# Patient Record
Sex: Male | Born: 1965
Health system: Southern US, Community
[De-identification: ages and names within clinical notes are randomized; demographics above are authoritative.]

## PROBLEM LIST (undated history)

## (undated) DIAGNOSIS — Z720 Tobacco use: Secondary | ICD-10-CM

## (undated) DIAGNOSIS — I2510 Atherosclerotic heart disease of native coronary artery without angina pectoris: Secondary | ICD-10-CM

## (undated) DIAGNOSIS — I5042 Chronic combined systolic (congestive) and diastolic (congestive) heart failure: Secondary | ICD-10-CM

## (undated) DIAGNOSIS — I219 Acute myocardial infarction, unspecified: Secondary | ICD-10-CM

## (undated) DIAGNOSIS — R0600 Dyspnea, unspecified: Secondary | ICD-10-CM

## (undated) DIAGNOSIS — F1010 Alcohol abuse, uncomplicated: Secondary | ICD-10-CM

## (undated) DIAGNOSIS — I255 Ischemic cardiomyopathy: Secondary | ICD-10-CM

## (undated) DIAGNOSIS — I340 Nonrheumatic mitral (valve) insufficiency: Secondary | ICD-10-CM

## (undated) DIAGNOSIS — F419 Anxiety disorder, unspecified: Secondary | ICD-10-CM

## (undated) HISTORY — PX: ANKLE SURGERY: SHX546

## (undated) HISTORY — PX: TONSILLECTOMY: SUR1361

## (undated) HISTORY — PX: SHOULDER SURGERY: SHX246

## (undated) HISTORY — DX: Atherosclerotic heart disease of native coronary artery without angina pectoris: I25.10

## (undated) HISTORY — PX: CARDIAC CATHETERIZATION: SHX172

## (undated) HISTORY — DX: Chronic combined systolic (congestive) and diastolic (congestive) heart failure: I50.42

## (undated) HISTORY — DX: Alcohol abuse, uncomplicated: F10.10

## (undated) HISTORY — DX: Nonrheumatic mitral (valve) insufficiency: I34.0

## (undated) HISTORY — DX: Tobacco use: Z72.0

## (undated) HISTORY — DX: Ischemic cardiomyopathy: I25.5

---

## 2000-08-18 ENCOUNTER — Observation Stay (HOSPITAL_COMMUNITY): Admission: RE | Admit: 2000-08-18 | Discharge: 2000-08-18 | Payer: Self-pay | Admitting: Orthopedic Surgery

## 2000-08-18 ENCOUNTER — Encounter: Payer: Self-pay | Admitting: Orthopedic Surgery

## 2000-10-21 ENCOUNTER — Ambulatory Visit (HOSPITAL_COMMUNITY): Admission: RE | Admit: 2000-10-21 | Discharge: 2000-10-21 | Payer: Self-pay | Admitting: Orthopedic Surgery

## 2010-05-25 NOTE — Op Note (Signed)
[facility]  Patient:    Eduardo [name], Eduardo [name] Visit Number: [id number] MRN: [id number]          Service Type: DSU Location: DAY Attending Physician:  Marlowe Kays Page Proc. Date: 10/21/00 Admit Date:  10/21/2000                             Operative Report  PREOPERATIVE DIAGNOSIS:  Retained Kirschner wire, left shoulder, status post acromioclavicular separation and repair.  POSTOPERATIVE DIAGNOSIS:  Retained Kirschner wire, left shoulder, status post acromioclavicular separation and repair.  OPERATION:  Removal of buried K-wire left shoulder.  SURGEON:  Illene Labrador. Aplington, M.D.  ASSISTANT:  Nurse.  ANESTHESIA:  MAC.  PATHOLOGY AND JUSTIFICATION FOR PROCEDURE:  I repaired his AC joint, stabilizing the two K-wires on 08/18/00.  One of the K-wires has worked its way out.  The other is time-wise ready for removal at this time.  It is buried in the subacromial area.  DESCRIPTION OF PROCEDURE:  Satisfactory MAC anesthesia, semi-setting position, with folded sheets beneath the left scapula, left shoulder was prepped with DuraPrep and draped out in a squared-off fashion with towels and Ioban, draped in a sterile field.  The area of tenderness and what I felt was the pin was at the posterior portion of the previous incision.  This was infiltrated with 0.5% Marcaine with adrenalin, and I then made an incision at this location and with blunt dissection, located the pin deep.  This was removed without difficulty.  I anesthetized the deeper portion of the wound once again with the Marcaine and adrenalin and closed the subcutaneous tissue with interrupted 3-0 Vicryl and the skin with interrupted 4-0 nylon mattress sutures.  Betadine and a band-aid patch were applied.  He tolerated the procedure well and at the time of this dictation was on his way to the recovery room in satisfactory condition with no known complication. Attending Physician:  Joaquin Courts DD:  10/21/00 TD:  10/21/00 Job: [id number]
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
## 2016-10-11 ENCOUNTER — Emergency Department (HOSPITAL_COMMUNITY)
Admission: EM | Admit: 2016-10-11 | Discharge: 2016-10-12 | Disposition: A | Discharge status: Home / Self Care | Payer: BLUE CROSS/BLUE SHIELD | Attending: Emergency Medicine | Admitting: Emergency Medicine

## 2016-10-11 ENCOUNTER — Encounter (HOSPITAL_COMMUNITY): Payer: Self-pay

## 2016-10-11 DIAGNOSIS — S82202A Unspecified fracture of shaft of left tibia, initial encounter for closed fracture: Secondary | ICD-10-CM

## 2016-10-11 DIAGNOSIS — F17200 Nicotine dependence, unspecified, uncomplicated: Secondary | ICD-10-CM | POA: Insufficient documentation

## 2016-10-11 DIAGNOSIS — S99912A Unspecified injury of left ankle, initial encounter: Secondary | ICD-10-CM | POA: Diagnosis present

## 2016-10-11 DIAGNOSIS — Y998 Other external cause status: Secondary | ICD-10-CM | POA: Diagnosis not present

## 2016-10-11 DIAGNOSIS — S82832A Other fracture of upper and lower end of left fibula, initial encounter for closed fracture: Secondary | ICD-10-CM

## 2016-10-11 DIAGNOSIS — Y9389 Activity, other specified: Secondary | ICD-10-CM | POA: Insufficient documentation

## 2016-10-11 DIAGNOSIS — Y929 Unspecified place or not applicable: Secondary | ICD-10-CM | POA: Diagnosis not present
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
## 2016-10-12 ENCOUNTER — Emergency Department (HOSPITAL_COMMUNITY): Payer: BLUE CROSS/BLUE SHIELD

## 2016-10-12 MED ORDER — HYDROCODONE-ACETAMINOPHEN 5-325 MG PO TABS: 1.0000 | ORAL_TABLET | Freq: Four times a day (QID) | ORAL | 0 refills | Status: DC | PRN

## 2016-10-12 MED ORDER — MORPHINE SULFATE (PF) 4 MG/ML IV SOLN
4.0000 mg | Freq: Once | INTRAVENOUS | Status: AC
  Administered 2016-10-12: 4 mg via INTRAVENOUS
  Filled 2016-10-12: qty 1
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
## 2016-10-12 NOTE — ED Notes (Signed)
Ortho tech paged  

## 2016-10-12 NOTE — ED Notes (Signed)
Pt stable, states understanding of discharge instructions, family at bedside 

## 2016-10-17 ENCOUNTER — Ambulatory Visit (INDEPENDENT_AMBULATORY_CARE_PROVIDER_SITE_OTHER): Payer: Self-pay | Admitting: Orthopedic Surgery

## 2016-10-18 ENCOUNTER — Encounter (HOSPITAL_COMMUNITY): Payer: Self-pay

## 2016-10-18 ENCOUNTER — Other Ambulatory Visit: Payer: Self-pay | Admitting: Orthopedic Surgery

## 2016-10-18 NOTE — Progress Notes (Signed)
Pt has history of heart attack and cardiac catheterization in July of 2008. Pt states he had his cath done at [facility]. No documents were found from care everywhere concerning his cath. Pt also states that he has not seen a cardiologist in 10 years. He also does not have a recent EKG. Anesthesia was notified- Dr. Chaney Malling was notified and stated that patient's surgery may need to be delayed or may be cancelled until cardiology can see patient.

## 2016-10-21 ENCOUNTER — Other Ambulatory Visit: Payer: Self-pay | Admitting: Cardiology

## 2016-10-21 ENCOUNTER — Inpatient Hospital Stay (HOSPITAL_COMMUNITY): Payer: BLUE CROSS/BLUE SHIELD | Admitting: Emergency Medicine

## 2016-10-21 ENCOUNTER — Inpatient Hospital Stay (HOSPITAL_COMMUNITY): Payer: BLUE CROSS/BLUE SHIELD

## 2016-10-21 ENCOUNTER — Encounter (HOSPITAL_COMMUNITY): Payer: Self-pay | Admitting: *Deleted

## 2016-10-21 ENCOUNTER — Inpatient Hospital Stay (HOSPITAL_COMMUNITY)
Admission: RE | Admit: 2016-10-21 | Discharge: 2016-10-27 | DRG: 492 | Disposition: A | Discharge status: Home / Self Care | Payer: BLUE CROSS/BLUE SHIELD | Source: Ambulatory Visit | Attending: Internal Medicine | Admitting: Internal Medicine

## 2016-10-21 ENCOUNTER — Encounter (HOSPITAL_COMMUNITY)
Admission: RE | Disposition: A | Discharge status: Home / Self Care | Payer: Self-pay | Source: Ambulatory Visit | Attending: Internal Medicine

## 2016-10-21 DIAGNOSIS — Z888 Allergy status to other drugs, medicaments and biological substances status: Secondary | ICD-10-CM

## 2016-10-21 DIAGNOSIS — Z823 Family history of stroke: Secondary | ICD-10-CM

## 2016-10-21 DIAGNOSIS — R079 Chest pain, unspecified: Secondary | ICD-10-CM

## 2016-10-21 DIAGNOSIS — Z880 Allergy status to penicillin: Secondary | ICD-10-CM | POA: Diagnosis not present

## 2016-10-21 DIAGNOSIS — I255 Ischemic cardiomyopathy: Secondary | ICD-10-CM | POA: Diagnosis present

## 2016-10-21 DIAGNOSIS — J989 Respiratory disorder, unspecified: Secondary | ICD-10-CM | POA: Diagnosis not present

## 2016-10-21 DIAGNOSIS — Z955 Presence of coronary angioplasty implant and graft: Secondary | ICD-10-CM | POA: Diagnosis not present

## 2016-10-21 DIAGNOSIS — J810 Acute pulmonary edema: Secondary | ICD-10-CM | POA: Diagnosis present

## 2016-10-21 DIAGNOSIS — I340 Nonrheumatic mitral (valve) insufficiency: Secondary | ICD-10-CM | POA: Diagnosis not present

## 2016-10-21 DIAGNOSIS — I208 Other forms of angina pectoris: Secondary | ICD-10-CM | POA: Diagnosis not present

## 2016-10-21 DIAGNOSIS — S82202A Unspecified fracture of shaft of left tibia, initial encounter for closed fracture: Secondary | ICD-10-CM | POA: Diagnosis present

## 2016-10-21 DIAGNOSIS — I25110 Atherosclerotic heart disease of native coronary artery with unstable angina pectoris: Secondary | ICD-10-CM | POA: Diagnosis not present

## 2016-10-21 DIAGNOSIS — S82112A Displaced fracture of left tibial spine, initial encounter for closed fracture: Secondary | ICD-10-CM | POA: Diagnosis not present

## 2016-10-21 DIAGNOSIS — R0902 Hypoxemia: Secondary | ICD-10-CM

## 2016-10-21 DIAGNOSIS — J9601 Acute respiratory failure with hypoxia: Secondary | ICD-10-CM | POA: Diagnosis present

## 2016-10-21 DIAGNOSIS — F10239 Alcohol dependence with withdrawal, unspecified: Secondary | ICD-10-CM | POA: Diagnosis present

## 2016-10-21 DIAGNOSIS — Z79899 Other long term (current) drug therapy: Secondary | ICD-10-CM | POA: Diagnosis not present

## 2016-10-21 DIAGNOSIS — I5021 Acute systolic (congestive) heart failure: Secondary | ICD-10-CM | POA: Diagnosis not present

## 2016-10-21 DIAGNOSIS — R748 Abnormal levels of other serum enzymes: Secondary | ICD-10-CM | POA: Diagnosis not present

## 2016-10-21 DIAGNOSIS — F17210 Nicotine dependence, cigarettes, uncomplicated: Secondary | ICD-10-CM | POA: Diagnosis present

## 2016-10-21 DIAGNOSIS — E876 Hypokalemia: Secondary | ICD-10-CM | POA: Diagnosis present

## 2016-10-21 DIAGNOSIS — I5043 Acute on chronic combined systolic (congestive) and diastolic (congestive) heart failure: Secondary | ICD-10-CM | POA: Diagnosis present

## 2016-10-21 DIAGNOSIS — I214 Non-ST elevation (NSTEMI) myocardial infarction: Secondary | ICD-10-CM

## 2016-10-21 DIAGNOSIS — Z7982 Long term (current) use of aspirin: Secondary | ICD-10-CM | POA: Diagnosis not present

## 2016-10-21 DIAGNOSIS — F329 Major depressive disorder, single episode, unspecified: Secondary | ICD-10-CM | POA: Diagnosis present

## 2016-10-21 DIAGNOSIS — E785 Hyperlipidemia, unspecified: Secondary | ICD-10-CM | POA: Diagnosis present

## 2016-10-21 DIAGNOSIS — Z9889 Other specified postprocedural states: Secondary | ICD-10-CM

## 2016-10-21 DIAGNOSIS — I110 Hypertensive heart disease with heart failure: Secondary | ICD-10-CM | POA: Diagnosis present

## 2016-10-21 DIAGNOSIS — I5042 Chronic combined systolic (congestive) and diastolic (congestive) heart failure: Secondary | ICD-10-CM

## 2016-10-21 DIAGNOSIS — I2510 Atherosclerotic heart disease of native coronary artery without angina pectoris: Secondary | ICD-10-CM | POA: Diagnosis present

## 2016-10-21 DIAGNOSIS — I2582 Chronic total occlusion of coronary artery: Secondary | ICD-10-CM | POA: Diagnosis present

## 2016-10-21 DIAGNOSIS — S82102A Unspecified fracture of upper end of left tibia, initial encounter for closed fracture: Secondary | ICD-10-CM | POA: Diagnosis not present

## 2016-10-21 DIAGNOSIS — I252 Old myocardial infarction: Secondary | ICD-10-CM | POA: Diagnosis present

## 2016-10-21 DIAGNOSIS — W19XXXA Unspecified fall, initial encounter: Secondary | ICD-10-CM | POA: Diagnosis present

## 2016-10-21 DIAGNOSIS — R778 Other specified abnormalities of plasma proteins: Secondary | ICD-10-CM

## 2016-10-21 DIAGNOSIS — R7989 Other specified abnormal findings of blood chemistry: Secondary | ICD-10-CM

## 2016-10-21 DIAGNOSIS — Z885 Allergy status to narcotic agent status: Secondary | ICD-10-CM | POA: Diagnosis not present

## 2016-10-21 DIAGNOSIS — I509 Heart failure, unspecified: Secondary | ICD-10-CM | POA: Diagnosis not present

## 2016-10-21 DIAGNOSIS — Z419 Encounter for procedure for purposes other than remedying health state, unspecified: Secondary | ICD-10-CM

## 2016-10-21 DIAGNOSIS — I426 Alcoholic cardiomyopathy: Secondary | ICD-10-CM | POA: Diagnosis present

## 2016-10-21 DIAGNOSIS — R000 Tachycardia, unspecified: Secondary | ICD-10-CM | POA: Diagnosis present

## 2016-10-21 DIAGNOSIS — S82252A Displaced comminuted fracture of shaft of left tibia, initial encounter for closed fracture: Secondary | ICD-10-CM | POA: Diagnosis present

## 2016-10-21 HISTORY — DX: Dyspnea, unspecified: R06.00

## 2016-10-21 HISTORY — PX: TIBIA IM NAIL INSERTION: SHX2516

## 2016-10-21 HISTORY — DX: Anxiety disorder, unspecified: F41.9

## 2016-10-21 HISTORY — DX: Acute myocardial infarction, unspecified: I21.9

## 2016-10-21 LAB — SURGICAL PCR SCREEN
MRSA, PCR: NEGATIVE
Staphylococcus aureus: NEGATIVE

## 2016-10-21 LAB — BASIC METABOLIC PANEL
Anion gap: 10 (ref 5–15)
BUN: 10 mg/dL (ref 6–20)
CHLORIDE: 104 mmol/L (ref 101–111)
CO2: 26 mmol/L (ref 22–32)
CREATININE: 0.84 mg/dL (ref 0.61–1.24)
Calcium: 9.1 mg/dL (ref 8.9–10.3)
GFR calc Af Amer: >60 mL/min (ref >60)
GFR calc non Af Amer: >60 mL/min (ref >60)
GLUCOSE: 104 mg/dL — AB (ref 65–99)
Potassium: 4.1 mmol/L (ref 3.5–5.1)
SODIUM: 140 mmol/L (ref 135–145)

## 2016-10-21 LAB — CBC
HEMATOCRIT: 37.2 % — AB (ref 39.0–52.0)
Hemoglobin: 12.5 g/dL — ABNORMAL LOW (ref 13.0–17.0)
MCH: 33.6 pg (ref 26.0–34.0)
MCHC: 33.6 g/dL (ref 30.0–36.0)
MCV: 100 fL (ref 78.0–100.0)
PLATELETS: 321 10*3/uL (ref 150–400)
RBC: 3.72 MIL/uL — ABNORMAL LOW (ref 4.22–5.81)
RDW: 12 % (ref 11.5–15.5)
WBC: 13.3 10*3/uL — ABNORMAL HIGH (ref 4.0–10.5)

## 2016-10-21 SURGERY — INSERTION, INTRAMEDULLARY ROD, TIBIA
Anesthesia: Spinal | Site: Leg Lower | Laterality: Left

## 2016-10-21 MED ORDER — PROPOFOL 500 MG/50ML IV EMUL
INTRAVENOUS | Status: DC | PRN
  Administered 2016-10-21: 50 ug/kg/min via INTRAVENOUS

## 2016-10-21 MED ORDER — HYDROMORPHONE HCL 1 MG/ML IJ SOLN
INTRAMUSCULAR | Status: AC
  Filled 2016-10-21: qty 1

## 2016-10-21 MED ORDER — LACTATED RINGERS IV SOLN
INTRAVENOUS | Status: DC
  Administered 2016-10-21 (×3): via INTRAVENOUS

## 2016-10-21 MED ORDER — METHOCARBAMOL 500 MG PO TABS
ORAL_TABLET | ORAL | Status: AC
  Administered 2016-10-21: 500 mg via ORAL
  Filled 2016-10-21: qty 1

## 2016-10-21 MED ORDER — ZOLPIDEM TARTRATE 5 MG PO TABS
5.0000 mg | ORAL_TABLET | Freq: Every evening | ORAL | Status: DC | PRN
  Administered 2016-10-21 – 2016-10-26 (×5): 5 mg via ORAL
  Filled 2016-10-21 (×5): qty 1

## 2016-10-21 MED ORDER — SODIUM CHLORIDE 0.9 % IV SOLN
INTRAVENOUS | Status: DC
  Administered 2016-10-21: 19:00:00 via INTRAVENOUS

## 2016-10-21 MED ORDER — METHOCARBAMOL 1000 MG/10ML IJ SOLN
500.0000 mg | Freq: Four times a day (QID) | INTRAMUSCULAR | Status: DC | PRN
  Filled 2016-10-21: qty 5

## 2016-10-21 MED ORDER — FENTANYL CITRATE (PF) 100 MCG/2ML IJ SOLN: 25.0000 ug | INTRAMUSCULAR | Status: DC | PRN

## 2016-10-21 MED ORDER — OXYCODONE-ACETAMINOPHEN 5-325 MG PO TABS: 1.0000 | ORAL_TABLET | ORAL | 0 refills | Status: DC | PRN

## 2016-10-21 MED ORDER — FENTANYL CITRATE (PF) 250 MCG/5ML IJ SOLN
INTRAMUSCULAR | Status: DC | PRN
  Administered 2016-10-21 (×3): 50 ug via INTRAVENOUS

## 2016-10-21 MED ORDER — BUPIVACAINE-EPINEPHRINE 0.5% -1:200000 IJ SOLN
INTRAMUSCULAR | Status: DC | PRN
  Administered 2016-10-21: 30 mL

## 2016-10-21 MED ORDER — BUPIVACAINE-EPINEPHRINE (PF) 0.5% -1:200000 IJ SOLN
INTRAMUSCULAR | Status: AC
  Filled 2016-10-21: qty 30

## 2016-10-21 MED ORDER — ACETAMINOPHEN 650 MG RE SUPP: 650.0000 mg | Freq: Four times a day (QID) | RECTAL | Status: DC | PRN

## 2016-10-21 MED ORDER — BISACODYL 5 MG PO TBEC
5.0000 mg | DELAYED_RELEASE_TABLET | Freq: Every day | ORAL | Status: DC | PRN
  Administered 2016-10-24 – 2016-10-26 (×2): 5 mg via ORAL
  Filled 2016-10-21 (×2): qty 1

## 2016-10-21 MED ORDER — MEPERIDINE HCL 25 MG/ML IJ SOLN: 6.2500 mg | INTRAMUSCULAR | Status: DC | PRN

## 2016-10-21 MED ORDER — ONDANSETRON HCL 4 MG/2ML IJ SOLN
INTRAMUSCULAR | Status: AC
  Filled 2016-10-21: qty 2

## 2016-10-21 MED ORDER — TIZANIDINE HCL 2 MG PO TABS: 2.0000 mg | ORAL_TABLET | Freq: Three times a day (TID) | ORAL | 0 refills | Status: DC | PRN

## 2016-10-21 MED ORDER — HYDROMORPHONE HCL 1 MG/ML IJ SOLN
0.5000 mg | INTRAMUSCULAR | Status: DC | PRN
  Administered 2016-10-21 – 2016-10-22 (×3): 1 mg via INTRAVENOUS
  Administered 2016-10-23 – 2016-10-24 (×3): 0.5 mg via INTRAVENOUS
  Administered 2016-10-25: 1 mg via INTRAVENOUS
  Filled 2016-10-21 (×4): qty 1
  Filled 2016-10-21 (×3): qty 0.5

## 2016-10-21 MED ORDER — ACETAMINOPHEN 325 MG PO TABS: 650.0000 mg | ORAL_TABLET | Freq: Four times a day (QID) | ORAL | Status: DC | PRN

## 2016-10-21 MED ORDER — LIDOCAINE 2% (20 MG/ML) 5 ML SYRINGE
INTRAMUSCULAR | Status: AC
  Filled 2016-10-21: qty 5

## 2016-10-21 MED ORDER — METOCLOPRAMIDE HCL 5 MG/ML IJ SOLN: 10.0000 mg | Freq: Once | INTRAMUSCULAR | Status: DC | PRN

## 2016-10-21 MED ORDER — OXYCODONE HCL 5 MG PO TABS
5.0000 mg | ORAL_TABLET | ORAL | Status: DC | PRN
  Administered 2016-10-21 (×2): 10 mg via ORAL
  Administered 2016-10-22: 5 mg via ORAL
  Administered 2016-10-22: 10 mg via ORAL
  Administered 2016-10-23 – 2016-10-24 (×2): 5 mg via ORAL
  Administered 2016-10-26: 10 mg via ORAL
  Administered 2016-10-26: 5 mg via ORAL
  Administered 2016-10-27: 10 mg via ORAL
  Filled 2016-10-21 (×2): qty 2
  Filled 2016-10-21 (×3): qty 1
  Filled 2016-10-21 (×3): qty 2
  Filled 2016-10-21: qty 1

## 2016-10-21 MED ORDER — LACTATED RINGERS IV SOLN: INTRAVENOUS | Status: DC

## 2016-10-21 MED ORDER — CLINDAMYCIN PHOSPHATE 900 MG/50ML IV SOLN
900.0000 mg | INTRAVENOUS | Status: AC
  Administered 2016-10-21: 900 mg via INTRAVENOUS

## 2016-10-21 MED ORDER — POLYETHYLENE GLYCOL 3350 17 G PO PACK
17.0000 g | PACK | Freq: Every day | ORAL | Status: DC | PRN
  Administered 2016-10-24: 17 g via ORAL
  Filled 2016-10-21: qty 1

## 2016-10-21 MED ORDER — ONDANSETRON HCL 4 MG/2ML IJ SOLN
4.0000 mg | Freq: Four times a day (QID) | INTRAMUSCULAR | Status: DC | PRN
  Administered 2016-10-23: 4 mg via INTRAVENOUS
  Filled 2016-10-21 (×3): qty 2

## 2016-10-21 MED ORDER — CEFAZOLIN SODIUM-DEXTROSE 2-4 GM/100ML-% IV SOLN
2.0000 g | Freq: Four times a day (QID) | INTRAVENOUS | Status: AC
  Administered 2016-10-21 – 2016-10-22 (×3): 2 g via INTRAVENOUS
  Filled 2016-10-21 (×4): qty 100

## 2016-10-21 MED ORDER — METHOCARBAMOL 500 MG PO TABS
500.0000 mg | ORAL_TABLET | Freq: Four times a day (QID) | ORAL | Status: DC | PRN
  Administered 2016-10-21: 500 mg via ORAL
  Filled 2016-10-21 (×2): qty 1

## 2016-10-21 MED ORDER — NITROGLYCERIN IN D5W 200-5 MCG/ML-% IV SOLN
INTRAVENOUS | Status: DC | PRN
  Administered 2016-10-21: 20 ug/min via INTRAVENOUS

## 2016-10-21 MED ORDER — ONDANSETRON HCL 4 MG PO TABS
4.0000 mg | ORAL_TABLET | Freq: Four times a day (QID) | ORAL | Status: DC | PRN
  Filled 2016-10-21: qty 1

## 2016-10-21 MED ORDER — TERBINAFINE HCL 1 % EX CREA
TOPICAL_CREAM | Freq: Every day | CUTANEOUS | Status: DC
  Administered 2016-10-21 – 2016-10-24 (×3): via TOPICAL
  Administered 2016-10-25: 1 via TOPICAL
  Administered 2016-10-26: 14:00:00 via TOPICAL
  Administered 2016-10-27: 1 via TOPICAL
  Filled 2016-10-21 (×3): qty 12

## 2016-10-21 MED ORDER — DOCUSATE SODIUM 100 MG PO CAPS
100.0000 mg | ORAL_CAPSULE | Freq: Two times a day (BID) | ORAL | Status: DC
  Administered 2016-10-21 – 2016-10-26 (×7): 100 mg via ORAL
  Filled 2016-10-21 (×10): qty 1

## 2016-10-21 MED ORDER — MIDAZOLAM HCL 2 MG/2ML IJ SOLN
INTRAMUSCULAR | Status: DC | PRN
  Administered 2016-10-21: 2 mg via INTRAVENOUS

## 2016-10-21 MED ORDER — 0.9 % SODIUM CHLORIDE (POUR BTL) OPTIME
TOPICAL | Status: DC | PRN
  Administered 2016-10-21: 1000 mL

## 2016-10-21 MED ORDER — METOPROLOL SUCCINATE ER 25 MG PO TB24
12.5000 mg | ORAL_TABLET | Freq: Every day | ORAL | Status: DC
  Administered 2016-10-22 – 2016-10-23 (×2): 12.5 mg via ORAL
  Filled 2016-10-21 (×3): qty 1

## 2016-10-21 MED ORDER — POVIDONE-IODINE 10 % EX SWAB: 2.0000 "application " | Freq: Once | CUTANEOUS | Status: DC

## 2016-10-21 MED ORDER — BUPIVACAINE IN DEXTROSE 0.75-8.25 % IT SOLN
INTRATHECAL | Status: DC | PRN
  Administered 2016-10-21: 1.5 mL via INTRATHECAL

## 2016-10-21 MED ORDER — MIDAZOLAM HCL 2 MG/2ML IJ SOLN
INTRAMUSCULAR | Status: AC
  Filled 2016-10-21: qty 2

## 2016-10-21 MED ORDER — ATORVASTATIN CALCIUM 20 MG PO TABS: 20.0000 mg | ORAL_TABLET | Freq: Every day | ORAL | Status: DC

## 2016-10-21 MED ORDER — FENTANYL CITRATE (PF) 250 MCG/5ML IJ SOLN
INTRAMUSCULAR | Status: AC
  Filled 2016-10-21: qty 5

## 2016-10-21 MED ORDER — PROPOFOL 10 MG/ML IV BOLUS
INTRAVENOUS | Status: AC
  Filled 2016-10-21: qty 20

## 2016-10-21 MED ORDER — ONDANSETRON HCL 4 MG/2ML IJ SOLN
INTRAMUSCULAR | Status: DC | PRN
  Administered 2016-10-21: 4 mg via INTRAVENOUS

## 2016-10-21 MED ORDER — SIMVASTATIN 40 MG PO TABS
40.0000 mg | ORAL_TABLET | Freq: Every day | ORAL | Status: DC
  Administered 2016-10-21 – 2016-10-23 (×3): 40 mg via ORAL
  Filled 2016-10-21 (×3): qty 1

## 2016-10-21 MED ORDER — CLINDAMYCIN PHOSPHATE 900 MG/50ML IV SOLN
INTRAVENOUS | Status: AC
  Filled 2016-10-21: qty 50

## 2016-10-21 MED ORDER — OXYCODONE HCL 5 MG PO TABS
ORAL_TABLET | ORAL | Status: AC
  Administered 2016-10-21: 10 mg via ORAL
  Filled 2016-10-21: qty 2

## 2016-10-21 MED ORDER — CHLORHEXIDINE GLUCONATE 4 % EX LIQD: 60.0000 mL | Freq: Once | CUTANEOUS | Status: DC

## 2016-10-21 MED ORDER — ASPIRIN EC 81 MG PO TBEC
81.0000 mg | DELAYED_RELEASE_TABLET | Freq: Every day | ORAL | Status: DC
  Administered 2016-10-21 – 2016-10-27 (×7): 81 mg via ORAL
  Filled 2016-10-21 (×7): qty 1

## 2016-10-21 SURGICAL SUPPLY — 72 items
BANDAGE ACE 4X5 VEL STRL LF (GAUZE/BANDAGES/DRESSINGS) ×3 IMPLANT
BANDAGE ACE 6X5 VEL STRL LF (GAUZE/BANDAGES/DRESSINGS) ×3 IMPLANT
BANDAGE ELASTIC 4 VELCRO ST LF (GAUZE/BANDAGES/DRESSINGS) ×2 IMPLANT
BANDAGE ELASTIC 6 VELCRO ST LF (GAUZE/BANDAGES/DRESSINGS) ×2 IMPLANT
BANDAGE ESMARK 6X9 LF (GAUZE/BANDAGES/DRESSINGS) IMPLANT
BIT DRILL 4.4 (MISCELLANEOUS) ×2 IMPLANT
BIT DRILL 6X3.8 (MISCELLANEOUS) ×2 IMPLANT
BLADE CLIPPER SURG (BLADE) ×2 IMPLANT
BLADE SURG 15 STRL LF DISP TIS (BLADE) ×1 IMPLANT
BLADE SURG 15 STRL SS (BLADE) ×3
BNDG CMPR 9X6 STRL LF SNTH (GAUZE/BANDAGES/DRESSINGS) ×1
BNDG COHESIVE 6X5 TAN STRL LF (GAUZE/BANDAGES/DRESSINGS) ×3 IMPLANT
BNDG ESMARK 6X9 LF (GAUZE/BANDAGES/DRESSINGS) ×3
BNDG GAUZE ELAST 4 BULKY (GAUZE/BANDAGES/DRESSINGS) ×3 IMPLANT
COVER SURGICAL LIGHT HANDLE (MISCELLANEOUS) ×2 IMPLANT
CUFF TOURNIQUET SINGLE 34IN LL (TOURNIQUET CUFF) ×2 IMPLANT
DRAPE C-ARM 42X72 X-RAY (DRAPES) ×3 IMPLANT
DRAPE C-ARMOR (DRAPES) ×2 IMPLANT
DRAPE HALF SHEET 40X57 (DRAPES) ×6 IMPLANT
DRAPE IMP U-DRAPE 54X76 (DRAPES) ×3 IMPLANT
DRAPE ORTHO SPLIT 77X108 STRL (DRAPES) ×6
DRAPE SURG ORHT 6 SPLT 77X108 (DRAPES) ×2 IMPLANT
DRAPE U-SHAPE 47X51 STRL (DRAPES) ×3 IMPLANT
DRSG MEPILEX BORDER 4X4 (GAUZE/BANDAGES/DRESSINGS) ×4 IMPLANT
DURAPREP 26ML APPLICATOR (WOUND CARE) ×3 IMPLANT
ELECT REM PT RETURN 9FT ADLT (ELECTROSURGICAL) ×3
ELECTRODE REM PT RTRN 9FT ADLT (ELECTROSURGICAL) ×1 IMPLANT
GAUZE SPONGE 4X4 12PLY STRL (GAUZE/BANDAGES/DRESSINGS) ×6 IMPLANT
GAUZE SPONGE 4X4 12PLY STRL LF (GAUZE/BANDAGES/DRESSINGS) ×2 IMPLANT
GAUZE XEROFORM 1X8 LF (GAUZE/BANDAGES/DRESSINGS) ×3 IMPLANT
GAUZE XEROFORM 5X9 LF (GAUZE/BANDAGES/DRESSINGS) ×2 IMPLANT
GLOVE BIO SURGEON STRL SZ 6.5 (GLOVE) ×1 IMPLANT
GLOVE BIO SURGEONS STRL SZ 6.5 (GLOVE) ×1
GLOVE BIOGEL PI IND STRL 6.5 (GLOVE) IMPLANT
GLOVE BIOGEL PI IND STRL 7.0 (GLOVE) IMPLANT
GLOVE BIOGEL PI IND STRL 8 (GLOVE) ×2 IMPLANT
GLOVE BIOGEL PI INDICATOR 6.5 (GLOVE) ×4
GLOVE BIOGEL PI INDICATOR 7.0 (GLOVE) ×2
GLOVE BIOGEL PI INDICATOR 8 (GLOVE) ×4
GLOVE ECLIPSE 7.5 STRL STRAW (GLOVE) ×6 IMPLANT
GOWN STRL REUS W/ TWL LRG LVL3 (GOWN DISPOSABLE) ×1 IMPLANT
GOWN STRL REUS W/ TWL XL LVL3 (GOWN DISPOSABLE) ×2 IMPLANT
GOWN STRL REUS W/TWL LRG LVL3 (GOWN DISPOSABLE) ×3
GOWN STRL REUS W/TWL XL LVL3 (GOWN DISPOSABLE) ×6
GUIDEWIRE BALL NOSE 80CM (WIRE) ×2 IMPLANT
KIT BASIN OR (CUSTOM PROCEDURE TRAY) ×3 IMPLANT
KIT ROOM TURNOVER OR (KITS) ×3 IMPLANT
MANIFOLD NEPTUNE II (INSTRUMENTS) ×3 IMPLANT
NAIL TIBIAL 9MMX33CM (Nail) ×2 IMPLANT
NEEDLE 22X1 1/2 (OR ONLY) (NEEDLE) ×3 IMPLANT
NS IRRIG 1000ML POUR BTL (IV SOLUTION) ×3 IMPLANT
PACK GENERAL/GYN (CUSTOM PROCEDURE TRAY) ×3 IMPLANT
PACK UNIVERSAL I (CUSTOM PROCEDURE TRAY) ×3 IMPLANT
PAD ABD 8X10 STRL (GAUZE/BANDAGES/DRESSINGS) ×4 IMPLANT
PAD ARMBOARD 7.5X6 YLW CONV (MISCELLANEOUS) ×6 IMPLANT
PADDING CAST COTTON 6X4 STRL (CAST SUPPLIES) ×4 IMPLANT
PIN GUIDE 3.2X14 1401214 (MISCELLANEOUS) ×2 IMPLANT
SCREW ACECAP 36MM (Screw) ×4 IMPLANT
SCREW ACECAP 40MM (Screw) ×2 IMPLANT
SCREW ACECAP 48MM (Screw) ×2 IMPLANT
SCREW CORTICAL 5.5 35MM (Screw) ×2 IMPLANT
SCREW PROXIMAL DEPUY (Screw) ×3 IMPLANT
SCREW PRXML FT 50X5.5XLCK NS (Screw) IMPLANT
SPLINT FIBERGLASS 4X30 (CAST SUPPLIES) ×2 IMPLANT
STAPLER VISISTAT 35W (STAPLE) ×3 IMPLANT
STOCKINETTE IMPERVIOUS LG (DRAPES) ×3 IMPLANT
SUT VIC AB 0 CTB1 27 (SUTURE) ×3 IMPLANT
SUT VIC AB 2-0 CTB1 (SUTURE) ×3 IMPLANT
SYR CONTROL 10ML LL (SYRINGE) ×3 IMPLANT
TOWEL OR 17X24 6PK STRL BLUE (TOWEL DISPOSABLE) ×3 IMPLANT
TOWEL OR 17X26 10 PK STRL BLUE (TOWEL DISPOSABLE) ×3 IMPLANT
WATER STERILE IRR 1000ML POUR (IV SOLUTION) ×3 IMPLANT

## 2016-10-21 NOTE — Brief Op Note (Signed)
10/21/2016  1:10 PM  PATIENT:  Eduardo [name]  51 y.o. male  PRE-OPERATIVE DIAGNOSIS:  LEFT TIBIA/ FIBULA FRACTURE  POST-OPERATIVE DIAGNOSIS:  LEFT TIBIA/ FIBULA FRACTURE  PROCEDURE:  Procedure(s): INTRAMEDULLARY (IM) ROD TIBIA/FIBULA FRACTURE (Left)  SURGEON:  Surgeon(s) and Role:    Jodi Geralds, MD - Primary  PHYSICIAN ASSISTANT:   ASSISTANTS: bethune   ANESTHESIA:   spinal  EBL:  Total I/O In: 1600 [I.V.:1600] Out: -   BLOOD ADMINISTERED:none  DRAINS: none   LOCAL MEDICATIONS USED:  NONE  SPECIMEN:  No Specimen  DISPOSITION OF SPECIMEN:  N/A  COUNTS:  YES  TOURNIQUET:   Total Tourniquet Time Documented: Thigh (Left) - 51 minutes Total: Thigh (Left) - 51 minutes   DICTATION: .Other Dictation: Dictation Number [id number]  PLAN OF CARE: Admit to inpatient   PATIENT DISPOSITION:  PACU - hemodynamically stable.   Delay start of Pharmacological VTE agent (>24hrs) due to surgical blood loss or risk of bleeding: no

## 2016-10-21 NOTE — Anesthesia Preprocedure Evaluation (Signed)
Anesthesia Evaluation  Patient identified by MRN, date of birth, ID band Patient awake    Reviewed: Allergy & Precautions, NPO status , Patient's Chart, lab work & pertinent test results  Airway Mallampati: II  TM Distance: >3 FB Neck ROM: Full    Dental no notable dental hx. (+) Poor Dentition, Chipped   Pulmonary Current Smoker,    Pulmonary exam normal breath sounds clear to auscultation       Cardiovascular + CAD, + Past MI and + Cardiac Stents (2008)  Normal cardiovascular exam Rhythm:Regular Rate:Normal     Neuro/Psych negative neurological ROS  negative psych ROS   GI/Hepatic negative GI ROS, Neg liver ROS,   Endo/Other  negative endocrine ROS  Renal/GU negative Renal ROS  negative genitourinary   Musculoskeletal negative musculoskeletal ROS (+)   Abdominal   Peds negative pediatric ROS (+)  Hematology negative hematology ROS (+)   Anesthesia Other Findings   Reproductive/Obstetrics negative OB ROS                             Anesthesia Physical Anesthesia Plan  ASA: III  Anesthesia Plan: Spinal   Post-op Pain Management:    Induction:   PONV Risk Score and Plan: 1 and Ondansetron and Treatment may vary due to age or medical condition  Airway Management Planned: Simple Face Mask  Additional Equipment:   Intra-op Plan:   Post-operative Plan:   Informed Consent: I have reviewed the patients History and Physical, chart, labs and discussed the procedure including the risks, benefits and alternatives for the proposed anesthesia with the patient or authorized representative who has indicated his/her understanding and acceptance.   Dental advisory given  Plan Discussed with:   Anesthesia Plan Comments: (SAB)        Anesthesia Quick Evaluation

## 2016-10-21 NOTE — H&P (Signed)
PREOPERATIVE H&P  Chief Complaint: Left leg pain  HPI: Eduardo [name] is a 51 y.o. male who presents for evaluation of Left leg pain after injury where he suffered a left tib-fib fracture.. It has been present for Less than a week and has been worsening. He has failed conservative measures. Pain is rated as severe.  Past Medical History:  Diagnosis Date  . Anxiety   . Dyspnea   . Myocardial infarction [facility])    July 2008   Past Surgical History:  Procedure Laterality Date  . ANKLE SURGERY    . CARDIAC CATHETERIZATION     stent placed 2008  . SHOULDER SURGERY    . TONSILLECTOMY     Social History   Social History  . Marital status: Single    Spouse name: N/A  . Number of children: N/A  . Years of education: N/A   Social History Main Topics  . Smoking status: Current Every Day Smoker    Packs/day: 1.00    Years: 40.00    Types: Cigarettes  . Smokeless tobacco: Never Used  . Alcohol use Yes  . Drug use: No  . Sexual activity: Not Asked   Other Topics Concern  . None   Social History Narrative  . None   Family History  Problem Relation Age of Onset  . Mitral valve prolapse Mother   . Stroke Father    Allergies  Allergen Reactions  . Penicillins Itching   Prior to Admission medications   Medication Sig Start Date End Date Taking? Authorizing Provider  aspirin EC 81 MG tablet Take 81 mg by mouth daily.   Yes [provider]  HYDROcodone-acetaminophen (NORCO/VICODIN) 5-325 MG tablet Take 1-2 tablets by mouth every 6 (six) hours as needed. 10/12/16  Yes Roxy Horseman, PA-C  Omega-3 Fatty Acids (FISH OIL PO) Take by mouth.   Yes [provider]     Positive ROS: none  All other systems have been reviewed and were otherwise negative with the exception of those mentioned in the HPI and as above.  Physical Exam: Vitals:   10/21/16 0913  BP: (!) 151/88  Pulse: (!) 106  Resp: 18  Temp: 98 F (36.7 C)  SpO2: 99%    General: Alert, no  acute distress Cardiovascular: No pedal edema Respiratory: No cyanosis, no use of accessory musculature GI: No organomegaly, abdomen is soft and non-tender Skin: No lesions in the area of chief complaint Neurologic: Sensation intact distally Psychiatric: Patient is competent for consent with normal mood and affect Lymphatic: No axillary or cervical lymphadenopathy  MUSCULOSKELETAL: Left leg.  Severe soft tissue swelling.  Obvious deformity.  Pain with all range of motion.  Nosignificant skin abnormalities.  Assessment/Plan: LEFT TIBIA/ FIBULA FRACTURE Plan for Procedure(s): INTRAMEDULLARY (IM) ROD TIBIA/FIBULA FRACTURE  The risks benefits and alternatives were discussed with the patient including but not limited to the risks of nonoperative treatment, versus surgical intervention including infection, bleeding, nerve injury, malunion, nonunion, hardware prominence, hardware failure, need for hardware removal, blood clots, cardiopulmonary complications, morbidity, mortality, among others, and they were willing to proceed.  Predicted outcome is good, although there will be at least a six to nine month expected recovery.  Aleiyah Halpin L, MD 10/21/2016 9:54 AM

## 2016-10-21 NOTE — Transfer of Care (Signed)
Immediate Anesthesia Transfer of Care Note  Patient: Eduardo [name]  Procedure(s) Performed: INTRAMEDULLARY (IM) ROD TIBIA/FIBULA FRACTURE (Left Leg Lower)  Patient Location: PACU  Anesthesia Type:Spinal  Level of Consciousness: awake, alert , oriented and patient cooperative  Airway & Oxygen Therapy: Patient Spontanous Breathing and Patient connected to nasal cannula oxygen  Post-op Assessment: Report given to RN, Post -op Vital signs reviewed and stable and Patient moving all extremities  Post vital signs: Reviewed and stable  Last Vitals:  Vitals:   10/21/16 0913  BP: (!) 151/88  Pulse: (!) 106  Resp: 18  Temp: 36.7 C  SpO2: 99%    Last Pain:  Vitals:   10/21/16 0913  TempSrc: Oral      Patients Stated Pain Goal: 3 (10/21/16 0955)  Complications: No apparent anesthesia complications

## 2016-10-21 NOTE — Consult Note (Signed)
Cardiology Consultation:   Patient ID: Eduardo [name]; [id number]; [birth year]   Admit date: 10/21/2016 Date of Consult: 10/21/2016  Primary Care Provider: Patient, No Pcp Per Primary Cardiologist: New Primary Electrophysiologist:  NA   Patient Profile:   Eduardo [name] is a 51 y.o. male with a hx of MI who is being seen today for the evaluation of chest pain post op at the request of Dr. Luiz Blare..  History of Present Illness:   Eduardo [name] has hx of MI in 2008 and now Lt leg pain undergoing surgery for Left tib75fib fracture.  He does smoke 1 to 1.5 PPD. Does take ASA daily but plavix he stopped 1.5 years after MI.   Pt had stent placed to LCX he believes, in 2008.  Pt stated he has had chest pain for last several months.  More at rest.  He takes an ASA and it goes away may last 5-10 min.  Episodes are not associated with exertion.  More at rest.  His mother had been ill and died 10-18-16 and the pressure was more associated with stress.   No associated symptoms except some SOB.  When he is at work he has no chest pain.       He denies any chest pain since surgery.  No chest pain now.  EKG:  The EKG was personally reviewed and demonstrates:   SR with non specific T wave abnormality.    Telemetry:  Telemetry was personally reviewed and demonstrates: SR  K+ 4.1, Cr 0.84 Hgb 12.5   Past Medical History:  Diagnosis Date  . Anxiety   . Dyspnea   . Myocardial infarction [facility])    July 2008    Past Surgical History:  Procedure Laterality Date  . ANKLE SURGERY    . CARDIAC CATHETERIZATION     stent placed 2008  . SHOULDER SURGERY    . TONSILLECTOMY       Home Medications:  Prior to Admission medications   Medication Sig Start Date End Date Taking? Authorizing Provider  aspirin EC 81 MG tablet Take 81 mg by mouth daily.   Yes [provider]  HYDROcodone-acetaminophen (NORCO/VICODIN) 5-325 MG tablet Take 1-2 tablets by mouth every 6 (six) hours as needed. 10/12/16   Yes Roxy Horseman, PA-C  Omega-3 Fatty Acids (FISH OIL PO) Take by mouth.   Yes [provider]  oxyCODONE-acetaminophen (PERCOCET/ROXICET) 5-325 MG tablet Take 1-2 tablets by mouth every 4 (four) hours as needed for severe pain. 10/21/16   Marshia Ly, PA-C  tiZANidine (ZANAFLEX) 2 MG tablet Take 1 tablet (2 mg total) by mouth every 8 (eight) hours as needed for muscle spasms. 10/21/16   Marshia Ly, PA-C    Inpatient Medications: Scheduled Meds: . chlorhexidine  60 mL Topical Once  . metoprolol succinate  12.5 mg Oral Daily  . povidone-iodine  2 application Topical Once  . simvastatin  40 mg Oral q1800   Continuous Infusions: . sodium chloride    . lactated ringers 50 mL/hr at 10/21/16 1011  . lactated ringers    . methocarbamol (ROBAXIN)  IV     PRN Meds: fentaNYL (SUBLIMAZE) injection, HYDROmorphone (DILAUDID) injection, meperidine (DEMEROL) injection, methocarbamol **OR** methocarbamol (ROBAXIN)  IV, metoCLOPramide, ondansetron **OR** ondansetron (ZOFRAN) IV, oxyCODONE  Allergies:    Allergies  Allergen Reactions  . Penicillins Itching    Social History:   Social History   Social History  . Marital status: Single    Spouse name: N/A  . Number  of children: N/A  . Years of education: N/A   Occupational History  . Not on file.   Social History Main Topics  . Smoking status: Current Every Day Smoker    Packs/day: 1.00    Years: 40.00    Types: Cigarettes  . Smokeless tobacco: Never Used  . Alcohol use Yes  . Drug use: No  . Sexual activity: Not on file   Other Topics Concern  . Not on file   Social History Narrative  . No narrative on file    Family History:    Family History  Problem Relation Age of Onset  . Mitral valve prolapse Mother   . Stroke Father   . Mitral valve prolapse Sister        mitral valve surgery     ROS:  Please see the history of present illness.  ROS  General:no colds or fevers, no weight changes Skin:no  rashes or ulcers HEENT:no blurred vision, no congestion CV:see HPI PUL:see HPI GI:no diarrhea constipation or melena, no indigestion GU:no hematuria, no dysuria MS:no joint pain, no claudication Neuro:no syncope, no lightheadedness Endo:no diabetes, no thyroid disease .     Physical Exam/Data:   Vitals:   10/21/16 1325 10/21/16 1340 10/21/16 1355 10/21/16 1425  BP: 118/77 119/84 (!) 135/109   Pulse: 82 81 80   Resp: Temp: 97.9 F (36.6 C)   98 F (36.7 C)  TempSrc:      SpO2: 97% 100% 96%     Intake/Output Summary (Last 24 hours) at 10/21/16 1607 Last data filed at 10/21/16 1323  Gross per 24 hour  Intake             1600 ml  Output               50 ml  Net             1550 ml   There were no vitals filed for this visit. There is no height or weight on file to calculate BMI.  General:  Well nourished, well developed, in no acute distress HEENT: normal Lymph: no adenopathy Neck: no JVD Endocrine:  No thryomegaly Vascular: No carotid bruits; pedal pulses 1+ Cardiac:  normal S1, S2; RRR; no murmur gallup, rub or click Lungs:  clear to auscultation bilaterally, no wheezing, rhonchi or rales  Abd: soft, nontender, no hepatomegaly  Ext: no edema Musculoskeletal:  No deformities of rt leg, BUE and BLE strength normal and equal, though unable to move lt leg today.   Skin: warm and dry  Neuro:  CNs 2-12 intact, no focal abnormalities noted Psych:  Normal affect     Relevant CV Studies: none  Laboratory Data:  Chemistry  Recent Labs Lab 10/21/16 0926  NA 140  K 4.1  CL 104  CO2 26  GLUCOSE 104*  BUN 10  CREATININE 0.84  CALCIUM 9.1  GFRNONAA >60  GFRAA >60  ANIONGAP 10    No results for input(s): PROT, ALBUMIN, AST, ALT, ALKPHOS, BILITOT in the last 168 hours. Hematology  Recent Labs Lab 10/21/16 0926  WBC 13.3*  RBC 3.72*  HGB 12.5*  HCT 37.2*  MCV 100.0  MCH 33.6  MCHC 33.6  RDW 12.0  PLT 321   Cardiac EnzymesNo results for  input(s): TROPONINI in the last 168 hours. No results for input(s): TROPIPOC in the last 168 hours.  BNPNo results for input(s): BNP, PROBNP in the last 168 hours.  DDimer No  results for input(s): DDIMER in the last 168 hours.  Radiology/Studies:  Dg Tibia/fibula Left  Result Date: 10/21/2016 CLINICAL DATA:  LEFT tibial fracture, surgery EXAM: LEFT TIBIA AND FIBULA - 2 VIEW; DG C-ARM 61-120 MIN COMPARISON:  10/12/2016 FLUOROSCOPY TIME:  2 minutes 29 seconds Images obtained: 4 FINDINGS: Diffuse osseous demineralization. IM nail with proximal and distal locking screws has been placed across an oblique distal LEFT tibial diaphyseal fracture. Knee and ankle joint alignments normal. Previously identified oblique proximal and distal LEFT fibular diaphyseal fractures are less well visualized on submitted images. IMPRESSION: Post nailing of a reduced distal LEFT tibial diaphyseal fracture. Oblique fractures of the proximal and distal LEFT fibular diaphysis. Electronically Signed   By: Ulyses Southward M.D.   On: 10/21/2016 15:31   Dg C-arm 1-60 Min  Result Date: 10/21/2016 CLINICAL DATA:  LEFT tibial fracture, surgery EXAM: LEFT TIBIA AND FIBULA - 2 VIEW; DG C-ARM 61-120 MIN COMPARISON:  10/12/2016 FLUOROSCOPY TIME:  2 minutes 29 seconds Images obtained: 4 FINDINGS: Diffuse osseous demineralization. IM nail with proximal and distal locking screws has been placed across an oblique distal LEFT tibial diaphyseal fracture. Knee and ankle joint alignments normal. Previously identified oblique proximal and distal LEFT fibular diaphyseal fractures are less well visualized on submitted images. IMPRESSION: Post nailing of a reduced distal LEFT tibial diaphyseal fracture. Oblique fractures of the proximal and distal LEFT fibular diaphysis. Electronically Signed   By: Ulyses Southward M.D.   On: 10/21/2016 15:31    Assessment and Plan:   1. Chest pain comes with stress.  None currently or post op.  He has not had much follow  up for CAD.  Would have him seen in office for nuc study once he has recovered.  He has no NTG, continue ASA 81 mg.  Will check lipids as outpt we could add statin before hand and low dose BB.  Dr. Excell Seltzer to see.   Add statin and BB  2.          Tobacco use discussed importance of stopping.    For questions or updates, please contact [facility] Please consult [email or web address] for contact info under Cardiology/STEMI.   Enzo Bi, MD  10/21/2016 4:07 PM   Patient seen, examined. Available data reviewed. Agree with findings, assessment, and plan as outlined by Nada Boozer, NP-C.   On my exam, the patient is a pleasant, age-appropriate male in NAD.  Vitals:   10/21/16 1355 10/21/16 1425  BP: (!) 135/109   Pulse: 80   Resp: 15   Temp:  98 F (36.7 C)  SpO2: 96%    Pt is alert and oriented, NAD HEENT: normal Neck: JVP - normal, carotids 2+= without bruits Lungs: CTA bilaterally CV: RRR without murmur or gallop Abd: soft, NT, Positive BS, no hepatomegaly Ext: no C/C/E, left leg post-op dressings in place Skin: warm/dry no rash Neuro: intact, no focal deficit  EKG shows normal sinus rhythm with no acute ST/T changes. The patient describes symptoms of stable angina over recent months. He really has not had any regular cardiology follow-up since PCI many years ago. He only takes a daily aspirin. He does not have any chest pain at this time and seems to have tolerated orthopedic surgery without any immediate complication. Would plan on starting him on a beta blocker and statin drug. He should continue on aspirin. Will arrange a Lexiscan Myoview stress test to evaluate ischemic burden in a few weeks and I would like to see  him in the office after his stress test is completed. We discussed issues around medical therapy. He's had some problems with limited resources in the past. Will prescribe simvastatin which should be available at a very low price and also will put him on  metoprolol.  Tonny Bollman, M.D. 10/21/2016 4:09 PM

## 2016-10-21 NOTE — Anesthesia Procedure Notes (Signed)
Spinal  Patient location during procedure: OR Staffing Anesthesiologist: Adysen Raphael Performed: anesthesiologist  Preanesthetic Checklist Completed: patient identified, site marked, surgical consent, pre-op evaluation, timeout performed, IV checked, risks and benefits discussed and monitors and equipment checked Spinal Block Patient position: sitting Prep: DuraPrep Patient monitoring: heart rate, continuous pulse ox and blood pressure Approach: right paramedian Location: L4-5 Injection technique: single-shot Needle Needle type: Sprotte  Needle gauge: 24 G Needle length: 9 cm Additional Notes Expiration date of kit checked and confirmed. Patient tolerated procedure well, without complications.       

## 2016-10-22 ENCOUNTER — Inpatient Hospital Stay (HOSPITAL_COMMUNITY): Payer: BLUE CROSS/BLUE SHIELD

## 2016-10-22 ENCOUNTER — Other Ambulatory Visit: Payer: Self-pay | Admitting: Cardiology

## 2016-10-22 ENCOUNTER — Encounter (HOSPITAL_COMMUNITY): Payer: Self-pay | Admitting: Orthopedic Surgery

## 2016-10-22 ENCOUNTER — Inpatient Hospital Stay (HOSPITAL_COMMUNITY)
Admission: RE | Disposition: A | Discharge status: Home / Self Care | Payer: Self-pay | Source: Ambulatory Visit | Attending: Internal Medicine

## 2016-10-22 ENCOUNTER — Other Ambulatory Visit: Payer: Self-pay

## 2016-10-22 DIAGNOSIS — J810 Acute pulmonary edema: Secondary | ICD-10-CM | POA: Diagnosis present

## 2016-10-22 DIAGNOSIS — I214 Non-ST elevation (NSTEMI) myocardial infarction: Secondary | ICD-10-CM

## 2016-10-22 DIAGNOSIS — I340 Nonrheumatic mitral (valve) insufficiency: Secondary | ICD-10-CM

## 2016-10-22 DIAGNOSIS — J9601 Acute respiratory failure with hypoxia: Secondary | ICD-10-CM | POA: Diagnosis present

## 2016-10-22 DIAGNOSIS — I2510 Atherosclerotic heart disease of native coronary artery without angina pectoris: Secondary | ICD-10-CM

## 2016-10-22 DIAGNOSIS — R748 Abnormal levels of other serum enzymes: Secondary | ICD-10-CM

## 2016-10-22 HISTORY — PX: LEFT HEART CATH AND CORONARY ANGIOGRAPHY: CATH118249

## 2016-10-22 LAB — BLOOD GAS, ARTERIAL
ACID-BASE DEFICIT: 1 mmol/L (ref 0.0–2.0)
Acid-Base Excess: 2 mmol/L (ref 0.0–2.0)
BICARBONATE: 24.1 mmol/L (ref 20.0–28.0)
BICARBONATE: 26.3 mmol/L (ref 20.0–28.0)
DELIVERY SYSTEMS: POSITIVE
Drawn by: 41977
Drawn by: 27027
Expiratory PAP: 9
FIO2: 100
INSPIRATORY PAP: 18
O2 SAT: 96.4 %
O2 Saturation: 75.4 %
PATIENT TEMPERATURE: 98.6
PEEP/CPAP: 9 cmH2O
PH ART: 7.404 (ref 7.350–7.450)
PO2 ART: 87.2 mmHg (ref 83.0–108.0)
Patient temperature: 98.7
pCO2 arterial: 47 mmHg (ref 32.0–48.0)
pCO2 arterial: 42.9 mmHg (ref 32.0–48.0)
pH, Arterial: 7.331 — ABNORMAL LOW (ref 7.350–7.450)
pO2, Arterial: 44.6 mmHg — ABNORMAL LOW (ref 83.0–108.0)

## 2016-10-22 LAB — BASIC METABOLIC PANEL
Anion gap: 10 (ref 5–15)
BUN: 10 mg/dL (ref 6–20)
CALCIUM: 8.7 mg/dL — AB (ref 8.9–10.3)
CO2: 26 mmol/L (ref 22–32)
CREATININE: 0.85 mg/dL (ref 0.61–1.24)
Chloride: 97 mmol/L — ABNORMAL LOW (ref 101–111)
GFR calc non Af Amer: >60 mL/min (ref >60)
Glucose, Bld: 193 mg/dL — ABNORMAL HIGH (ref 65–99)
Potassium: 4.3 mmol/L (ref 3.5–5.1)
SODIUM: 133 mmol/L — AB (ref 135–145)

## 2016-10-22 LAB — CBC
HEMATOCRIT: 38.6 % — AB (ref 39.0–52.0)
Hemoglobin: 13.2 g/dL (ref 13.0–17.0)
MCH: 34.6 pg — ABNORMAL HIGH (ref 26.0–34.0)
MCHC: 34.2 g/dL (ref 30.0–36.0)
MCV: 101 fL — ABNORMAL HIGH (ref 78.0–100.0)
Platelets: 361 10*3/uL (ref 150–400)
RBC: 3.82 MIL/uL — ABNORMAL LOW (ref 4.22–5.81)
RDW: 11.9 % (ref 11.5–15.5)
WBC: 22.3 10*3/uL — ABNORMAL HIGH (ref 4.0–10.5)

## 2016-10-22 LAB — PROTIME-INR
INR: 1.03
PROTHROMBIN TIME: 13.4 s (ref 11.4–15.2)

## 2016-10-22 LAB — ECHOCARDIOGRAM COMPLETE
Height: 67 in
Weight: 2726.4 oz

## 2016-10-22 LAB — BRAIN NATRIURETIC PEPTIDE: B Natriuretic Peptide: 233.5 pg/mL — ABNORMAL HIGH (ref 0.0–100.0)

## 2016-10-22 LAB — LACTIC ACID, PLASMA
Lactic Acid, Venous: 2.4 mmol/L (ref 0.5–1.9)
Lactic Acid, Venous: 1.4 mmol/L (ref 0.5–1.9)

## 2016-10-22 LAB — HEPARIN LEVEL (UNFRACTIONATED)

## 2016-10-22 LAB — PROCALCITONIN

## 2016-10-22 LAB — TROPONIN I: Troponin I: 5 ng/mL (ref <0.03)

## 2016-10-22 LAB — D-DIMER, QUANTITATIVE (NOT AT ARMC): D DIMER QUANT: 11.13 ug{FEU}/mL — AB (ref 0.00–0.50)

## 2016-10-22 LAB — MRSA PCR SCREENING: MRSA BY PCR: NEGATIVE

## 2016-10-22 SURGERY — LEFT HEART CATH AND CORONARY ANGIOGRAPHY
Anesthesia: LOCAL

## 2016-10-22 MED ORDER — ASPIRIN 81 MG PO CHEW: 81.0000 mg | CHEWABLE_TABLET | ORAL | Status: DC

## 2016-10-22 MED ORDER — IOPAMIDOL (ISOVUE-370) INJECTION 76%
INTRAVENOUS | Status: AC
  Administered 2016-10-22: 100 mL
  Filled 2016-10-22: qty 100

## 2016-10-22 MED ORDER — LIDOCAINE HCL (PF) 1 % IJ SOLN
INTRAMUSCULAR | Status: DC | PRN
  Administered 2016-10-22: 2 mL via SUBCUTANEOUS

## 2016-10-22 MED ORDER — ORAL CARE MOUTH RINSE
15.0000 mL | Freq: Two times a day (BID) | OROMUCOSAL | Status: DC
  Administered 2016-10-23 – 2016-10-27 (×5): 15 mL via OROMUCOSAL

## 2016-10-22 MED ORDER — ASPIRIN 81 MG PO CHEW: 81.0000 mg | CHEWABLE_TABLET | Freq: Every day | ORAL | Status: DC

## 2016-10-22 MED ORDER — SODIUM CHLORIDE 0.9 % IV SOLN: 250.0000 mL | INTRAVENOUS | Status: DC | PRN

## 2016-10-22 MED ORDER — SODIUM CHLORIDE 0.9 % IV SOLN: INTRAVENOUS | Status: AC

## 2016-10-22 MED ORDER — FUROSEMIDE 10 MG/ML IJ SOLN
INTRAMUSCULAR | Status: AC
  Filled 2016-10-22: qty 4

## 2016-10-22 MED ORDER — HEPARIN (PORCINE) IN NACL 2-0.9 UNIT/ML-% IJ SOLN
INTRAMUSCULAR | Status: AC | PRN
  Administered 2016-10-22: 1000 mL

## 2016-10-22 MED ORDER — SODIUM CHLORIDE 0.9 % IV SOLN
INTRAVENOUS | Status: DC
  Administered 2016-10-22: 13:00:00 via INTRAVENOUS

## 2016-10-22 MED ORDER — OXYCODONE HCL 5 MG PO TABS: 5.0000 mg | ORAL_TABLET | ORAL | Status: DC | PRN

## 2016-10-22 MED ORDER — ACETAMINOPHEN 325 MG PO TABS
650.0000 mg | ORAL_TABLET | ORAL | Status: DC | PRN
Start: 2016-10-22 — End: 2016-10-27

## 2016-10-22 MED ORDER — FUROSEMIDE 10 MG/ML IJ SOLN: 20.0000 mg | INTRAMUSCULAR | Status: AC

## 2016-10-22 MED ORDER — SODIUM CHLORIDE 0.9% FLUSH: 3.0000 mL | Freq: Two times a day (BID) | INTRAVENOUS | Status: DC

## 2016-10-22 MED ORDER — HEPARIN (PORCINE) IN NACL 2-0.9 UNIT/ML-% IJ SOLN
INTRAMUSCULAR | Status: AC
  Filled 2016-10-22: qty 1000

## 2016-10-22 MED ORDER — HEPARIN SODIUM (PORCINE) 1000 UNIT/ML IJ SOLN
INTRAMUSCULAR | Status: AC
  Filled 2016-10-22: qty 1

## 2016-10-22 MED ORDER — IOPAMIDOL (ISOVUE-370) INJECTION 76%
INTRAVENOUS | Status: DC | PRN
  Administered 2016-10-22: 60 mL via INTRAVENOUS

## 2016-10-22 MED ORDER — FUROSEMIDE 10 MG/ML IJ SOLN
INTRAMUSCULAR | Status: AC
  Administered 2016-10-22: 20 mg
  Filled 2016-10-22: qty 4

## 2016-10-22 MED ORDER — FUROSEMIDE 10 MG/ML IJ SOLN
INTRAMUSCULAR | Status: DC | PRN
  Administered 2016-10-22: 40 mg via INTRAVENOUS

## 2016-10-22 MED ORDER — VERAPAMIL HCL 2.5 MG/ML IV SOLN
INTRAVENOUS | Status: DC | PRN
  Administered 2016-10-22: 10 mL via INTRA_ARTERIAL

## 2016-10-22 MED ORDER — SODIUM CHLORIDE 0.9% FLUSH
3.0000 mL | Freq: Two times a day (BID) | INTRAVENOUS | Status: DC
  Administered 2016-10-22 – 2016-10-27 (×10): 3 mL via INTRAVENOUS

## 2016-10-22 MED ORDER — HEPARIN SODIUM (PORCINE) 1000 UNIT/ML IJ SOLN
INTRAMUSCULAR | Status: DC | PRN
  Administered 2016-10-22: 4000 [IU] via INTRAVENOUS

## 2016-10-22 MED ORDER — SODIUM CHLORIDE 0.9% FLUSH: 3.0000 mL | INTRAVENOUS | Status: DC | PRN

## 2016-10-22 MED ORDER — LIDOCAINE HCL 2 % IJ SOLN
INTRAMUSCULAR | Status: AC
  Filled 2016-10-22: qty 10

## 2016-10-22 MED ORDER — VERAPAMIL HCL 2.5 MG/ML IV SOLN
INTRAVENOUS | Status: AC
  Filled 2016-10-22: qty 2

## 2016-10-22 MED ORDER — IOPAMIDOL (ISOVUE-370) INJECTION 76%
INTRAVENOUS | Status: AC
  Filled 2016-10-22: qty 100

## 2016-10-22 MED ORDER — HEPARIN (PORCINE) IN NACL 100-0.45 UNIT/ML-% IJ SOLN
1700.0000 [IU]/h | INTRAMUSCULAR | Status: DC
  Administered 2016-10-23: 1500 [IU]/h via INTRAVENOUS
  Administered 2016-10-23: 1400 [IU]/h via INTRAVENOUS
  Administered 2016-10-24: 1700 [IU]/h via INTRAVENOUS
  Filled 2016-10-22 (×2): qty 250

## 2016-10-22 MED ORDER — ONDANSETRON HCL 4 MG/2ML IJ SOLN: 4.0000 mg | Freq: Four times a day (QID) | INTRAMUSCULAR | Status: DC | PRN

## 2016-10-22 MED ORDER — HEPARIN (PORCINE) IN NACL 100-0.45 UNIT/ML-% IJ SOLN
1400.0000 [IU]/h | INTRAMUSCULAR | Status: DC
  Administered 2016-10-22: 1200 [IU]/h via INTRAVENOUS
  Filled 2016-10-22: qty 250

## 2016-10-22 MED ORDER — HEPARIN BOLUS VIA INFUSION
4000.0000 [IU] | Freq: Once | INTRAVENOUS | Status: AC
  Administered 2016-10-22: 4000 [IU] via INTRAVENOUS
  Filled 2016-10-22: qty 4000

## 2016-10-22 SURGICAL SUPPLY — 9 items
CATH INFINITI 5 FR JL3.5 (CATHETERS) ×1 IMPLANT
CATH INFINITI JR4 5F (CATHETERS) ×1 IMPLANT
GLIDESHEATH SLEND A-KIT 6F 22G (SHEATH) ×1 IMPLANT
GUIDEWIRE INQWIRE 1.5J.035X260 (WIRE) IMPLANT
INQWIRE 1.5J .035X260CM (WIRE) ×2
KIT HEART LEFT (KITS) ×2 IMPLANT
PACK CARDIAC CATHETERIZATION (CUSTOM PROCEDURE TRAY) ×2 IMPLANT
TRANSDUCER W/STOPCOCK (MISCELLANEOUS) ×2 IMPLANT
TUBING CIL FLEX 10 FLL-RA (TUBING) ×2 IMPLANT

## 2016-10-22 NOTE — Progress Notes (Addendum)
Trop of 5.0 noted: 1) Heparin gtt started, strongly suspect either PE or ACS without CP to blame here. 2) calling cards to update them. 3) serial trops ordered 4) per cards APP Su Hilt: star heparin gtt, make NPO, and still get the CTA to r/o PE

## 2016-10-22 NOTE — Progress Notes (Signed)
Patient called stating he's coughing and has a lot of phlegm coming up. Went to assess patient and he stated he was asleep and woke up coughing, denied any chest pain. IS was given and encouraged patient to use IS and cough up the phlegm. Phlegm was noted to be thick but clear at the time. Was called back into the room by patient. Patient A/O but appeared to be SOB and can hear crackling with breathing. Still very actively coughing up phlegm which is thick, pink, and frothy. Finger tips are cyanotic. O2 stat on dinamap reading was initially in the 50's, heart rate 150's. Rapid and respiratory paged. Patient placed on nonrebreather and lingers around the mid 80's. ABG's, PCXR, EKG done at beside. On call ortho paged. Medical team consulted, has bed on 3M13. Report called and given to RN. Patient transferred off unit.

## 2016-10-22 NOTE — Progress Notes (Signed)
Subjective: 1 Day Post-Op Procedure(s) (LRB): INTRAMEDULLARY (IM) ROD TIBIA/FIBULA FRACTURE (Left) Patient reports pain as moderate. Desaturated at 3 AM last night.  Was transferred to the unit.  Started on heparin drip.  Labs ordered.  This morning the patient is awake alert and oriented.  He denies frank chest pain.  Objective: Vital signs in last 24 hours: Temp:  [97.7 F (36.5 C)-98.9 F (37.2 C)] 98.2 F (36.8 C) (10/16 0727) Pulse Rate:  [80-142] 121 (10/16 0826) Resp:  [13-33] 25 (10/16 0826) BP: (104-152)/(62-109) 118/78 (10/16 0826) SpO2:  [70 %-100 %] 92 % (10/16 0826) FiO2 (%):  [50 %] 50 % (10/16 0826) Weight:  [75.1 kg (165 lb 8 oz)-77.3 kg (170 lb 6.4 oz)] 77.3 kg (170 lb 6.4 oz) (10/16 0418)  Intake/Output from previous day: 10/15 0701 - 10/16 0700 In: 1843.3 [I.V.:1843.3] Out: 925 [Urine:875; Blood:50] Intake/Output this shift: No intake/output data recorded.   Recent Labs  10/21/16 0926 10/22/16 0416  HGB 12.5* 13.2    Recent Labs  10/21/16 0926 10/22/16 0416  WBC 13.3* 22.3*  RBC 3.72* 3.82*  HCT 37.2* 38.6*  PLT 321 361    Recent Labs  10/21/16 0926 10/22/16 0416  NA 140 133*  K 4.1 4.3  CL 104 97*  CO2 26 26  BUN 10 10  CREATININE 0.84 0.85  GLUCOSE 104* 193*  CALCIUM 9.1 8.7*  Troponin 5.0.BNP 233.5. D-dimer 11.13. o2 sats 100% on BiPAP. Left lower extremity exam:Posterior splint intact.  Moves toes actively.  Good sensation in toes. Dressing clean and dry.   Assessment/Plan: 1 Day Post-Op Procedure(s) (LRB): INTRAMEDULLARY (IM) ROD TIBIA/FIBULA FRACTURE (Left)  PE or ACS Plan: Greatly appreciate internal medicine Help with this patient. Also cardiology. Need to get CT angiogram to rule out PE.  The patient fractured his leg 5 days ago therefore would be at high risk at this point in time for PE. He is also a smoker. Dr. Luiz Blare aware of the situation and will see the patient today at lunchtime.  Eduardo [name] G 10/22/2016,  8:43 AM

## 2016-10-22 NOTE — H&P (View-Only) (Signed)
 Progress Note  Patient Name: Eduardo [name] Date of Encounter: 10/22/2016  Primary Cardiologist: Cooper  Subjective   No chest pain or dyspnea this am.   Inpatient Medications    Scheduled Meds: . aspirin EC  81 mg Oral Daily  . docusate sodium  100 mg Oral BID  . furosemide  20 mg Intravenous STAT  . metoprolol succinate  12.5 mg Oral Daily  . simvastatin  40 mg Oral q1800  . terbinafine   Topical Daily   Continuous Infusions: .  ceFAZolin (ANCEF) IV Stopped (10/22/16 0304)  . heparin 1,200 Units/hr (10/22/16 0644)  . methocarbamol (ROBAXIN)  IV     PRN Meds: acetaminophen **OR** acetaminophen, bisacodyl, HYDROmorphone (DILAUDID) injection, methocarbamol **OR** methocarbamol (ROBAXIN)  IV, ondansetron **OR** ondansetron (ZOFRAN) IV, oxyCODONE, polyethylene glycol, zolpidem   Vital Signs    Vitals:   10/22/16 0727 10/22/16 0800 10/22/16 0826 10/22/16 0909  BP: 114/84 104/78 118/78 101/75  Pulse: (!) 125 (!) 116 (!) 121 (!) 117  Resp: 19 18 (!) 25 (!) 23  Temp: 98.2 F (36.8 C)     TempSrc: Oral     SpO2: 99% 99% 92% 93%  Weight:      Height:        Intake/Output Summary (Last 24 hours) at 10/22/16 0959 Last data filed at 10/22/16 0650  Gross per 24 hour  Intake          1843.33 ml  Output              925 ml  Net           918.33 ml   Filed Weights   10/21/16 1759 10/22/16 0418  Weight: 165 lb 8 oz (75.1 kg) 170 lb 6.4 oz (77.3 kg)    Telemetry    Sinus tach, rate 120 - Personally Reviewed  ECG    Sinus tach, subtle ST and T wave changes diffusely- Personally Reviewed  Physical Exam   GEN: No acute distress.   Neck: No JVD Cardiac: Regular, tachy. no murmurs, rubs, or gallops.  Respiratory: Clear to auscultation bilaterally. GI: Soft, nontender, non-distended  MS:  No LE edema; No deformity. Neuro:  Nonfocal  Psych: Normal affect   Labs    Chemistry Recent Labs Lab 10/21/16 0926 10/22/16 0416  NA 140 133*  K 4.1 4.3  CL 104 97*   CO2 26 26  GLUCOSE 104* 193*  BUN 10 10  CREATININE 0.84 0.85  CALCIUM 9.1 8.7*  GFRNONAA >60 >60  GFRAA >60 >60  ANIONGAP 10 10     Hematology Recent Labs Lab 10/21/16 0926 10/22/16 0416  WBC 13.3* 22.3*  RBC 3.72* 3.82*  HGB 12.5* 13.2  HCT 37.2* 38.6*  MCV 100.0 101.0*  MCH 33.6 34.6*  MCHC 33.6 34.2  RDW 12.0 11.9  PLT 321 361    Cardiac Enzymes Recent Labs Lab 10/22/16 0416  TROPONINI 5.00*   No results for input(s): TROPIPOC in the last 168 hours.   BNP Recent Labs Lab 10/22/16 0417  BNP 233.5*     DDimer  Recent Labs Lab 10/22/16 0416  DDIMER 11.13*     Radiology    Dg Tibia/fibula Left  Result Date: 10/21/2016 CLINICAL DATA:  LEFT tibial fracture, surgery EXAM: LEFT TIBIA AND FIBULA - 2 VIEW; DG C-ARM 61-120 MIN COMPARISON:  10/12/2016 FLUOROSCOPY TIME:  2 minutes 29 seconds Images obtained: 4 FINDINGS: Diffuse osseous demineralization. IM nail with proximal and distal locking screws has been placed   across an oblique distal LEFT tibial diaphyseal fracture. Knee and ankle joint alignments normal. Previously identified oblique proximal and distal LEFT fibular diaphyseal fractures are less well visualized on submitted images. IMPRESSION: Post nailing of a reduced distal LEFT tibial diaphyseal fracture. Oblique fractures of the proximal and distal LEFT fibular diaphysis. Electronically Signed   By: Ulyses Southward M.D.   On: 10/21/2016 15:31   Dg Chest Port 1 View  Result Date: 10/22/2016 CLINICAL DATA:  Oxygen desaturation. EXAM: PORTABLE CHEST 1 VIEW COMPARISON:  None. FINDINGS: Cardiac enlargement with pulmonary vascular congestion. Bilateral interstitial and perihilar airspace disease, more prominent on the right. This likely represents diffuse pulmonary edema. Multifocal pneumonia could also have this appearance. No blunting of costophrenic angles. No pneumothorax. Mediastinal contours appear intact. Calcification of the aorta. Degenerative changes in  the spine and shoulders. IMPRESSION: Cardiac enlargement with pulmonary vascular congestion and bilateral airspace and interstitial infiltrates, likely due to edema. Electronically Signed   By: Burman Nieves M.D.   On: 10/22/2016 04:15   Dg C-arm 1-60 Min  Result Date: 10/21/2016 CLINICAL DATA:  LEFT tibial fracture, surgery EXAM: LEFT TIBIA AND FIBULA - 2 VIEW; DG C-ARM 61-120 MIN COMPARISON:  10/12/2016 FLUOROSCOPY TIME:  2 minutes 29 seconds Images obtained: 4 FINDINGS: Diffuse osseous demineralization. IM nail with proximal and distal locking screws has been placed across an oblique distal LEFT tibial diaphyseal fracture. Knee and ankle joint alignments normal. Previously identified oblique proximal and distal LEFT fibular diaphyseal fractures are less well visualized on submitted images. IMPRESSION: Post nailing of a reduced distal LEFT tibial diaphyseal fracture. Oblique fractures of the proximal and distal LEFT fibular diaphysis. Electronically Signed   By: Ulyses Southward M.D.   On: 10/21/2016 15:31    Cardiac Studies     Patient Profile     51 y.o. male with CAD/prior MI/prior stenting of the Circumflex in 2008, tobacco abuse admitted after surgery for left tib/fib fracture. Pt with chest pain post op and was seen by Dr. Excell Seltzer. No EKG changes so plans for outpatient stress test. He had increased SOB last night around 4 am and reported pulmonary edema. Troponin elevated at 5.0. Only c/o dyspnea, no chest pain.   Assessment & Plan    1. Elevated troponin in setting of flash pulmonary edema in pt with known CAD and ongoing tobacco abuse: High probability of ACS. Troponin is now 5.0 at 4am. EKG with diffuse ST changes and T wave changes. I agree that he is also high risk for PE. Plans for chest CTA today to exclude PE. He will need a cardiac cath later today to exclude obstructive CAD. I have discussed this with Dr. Luiz Blare and his ok with anti-platelet therapy and anti-coagulation. He is on IV  heparin this am. He is having no chest pain currently. Risks and benefits of cath reviewed with pt and he agrees to proceed.    For questions or updates, please contact [facility] Please consult [email or web address] for contact info under Cardiology/STEMI.      Signed, Verne Carrow, MD  10/22/2016, 9:59 AM

## 2016-10-22 NOTE — Progress Notes (Signed)
ANTICOAGULATION CONSULT NOTE - Initial Consult  Pharmacy Consult for Heparin Indication: R/O PE/ACS  Allergies  Allergen Reactions  . Penicillins Itching    Patient Measurements: Height:  (170.2 cm) Weight: 170 lb 6.4 oz (77.3 kg) IBW/kg (Calculated) : 66.1  Assessment: 51 y.o. male with chest pain/SOB for heparin. No longer having CP but Cards plans to take to cath today. Heparin undetectable  Goal of Therapy:  Heparin level 0.3-0.7 units/ml Monitor platelets by anticoagulation protocol: Yes   Plan:  Increase heparin gtt to 1,400 units/hr Monitor daily heparin level, CBC, s/s of bleed F/U s/p cath  Enzo Bi, PharmD, BCPS Clinical Pharmacist Pager [phone] 10/22/2016 2:00 PM

## 2016-10-22 NOTE — Progress Notes (Signed)
CRITICAL VALUE ALERT  Critical Value:  Lactic Acid 2.4  Date & Time Notied:  10/22/2016  Provider Notified: Laban Emperor  Orders Received/Actions taken: no new orders at this time

## 2016-10-22 NOTE — Plan of Care (Signed)
Problem: Nutrition: Goal: Adequate nutrition will be maintained Outcome: Not Progressing NPO for procedure   

## 2016-10-22 NOTE — Consult Note (Addendum)
Medical Consultation   Eduardo [name]  ZOX:[id number]  DOB: 24-May-1965  DOA: 10/21/2016  PCP: Patient, No Pcp Per   Requesting physician: Cindie Crumbly PA-C  Reason for consultation: Respiratory distress, concern for PE   History of Present Illness: Eduardo [name] is an 51 y.o. male with h/o MI in July 2008.  He is POD #1 this morning from ORIF of L tib/fib fx.  He reportedly had CP post op yesterday and cardiology was consulted.  At the time of Dr. Earmon Phoenix evaluation (and since then) he had no CP.  This morning at about 4am or so he was awoken from sleep by sudden onset, severe, SOB, cough.  Cough productive of pink frothy sputum.  Finger tips were cyanotic.  O2 sat low, HR 150s sinus.  He denies CP, left jaw pain, left arm pain, etc.  EKG performed, does have ST changes in 2,3,AVF.  Cards fellow doesn't feel we should call STEMI at this time.  CXR shows pulmonary vascular congestion and pulmonary edema.  Respiratory status was stabilized with BIPAP, now tachy to 110s and satting mid 90s on BIPAP.   Review of Systems:  ROS As per HPI otherwise 10 point review of systems negative.    Past Medical History: Past Medical History:  Diagnosis Date  . Anxiety   . Dyspnea   . Myocardial infarction [facility])    July 2008    Past Surgical History: Past Surgical History:  Procedure Laterality Date  . ANKLE SURGERY    . CARDIAC CATHETERIZATION     stent placed 2008  . SHOULDER SURGERY    . TONSILLECTOMY       Allergies:   Allergies  Allergen Reactions  . Penicillins Itching     Social History:  reports that he has been smoking Cigarettes.  He has a 40.00 pack-year smoking history. He has never used smokeless tobacco. He reports that he drinks alcohol. He reports that he does not use drugs.   Family History: Family History  Problem Relation Age of Onset  . Mitral valve prolapse Mother   . Stroke Father   . Mitral valve prolapse Sister    mitral valve surgery      Physical Exam: Vitals:   10/22/16 0418 10/22/16 0422 10/22/16 0424 10/22/16 0430  BP:  125/87  120/90  Pulse: (!) 130 (!) 127  (!) 125  Resp:  19  (!) 33  Temp:      TempSrc:      SpO2: (!) 86% 93% 94% 96%  Weight: 77.3 kg (170 lb 6.4 oz)     Height:  (1.702 m)       Constitutional:  Alert and awake, oriented x3, no longer in respiratory distress now that he is on BIPAP Eyes: PERLA, EOMI, irises appear normal, anicteric sclera,  ENMT: external ears and nose appear normal,            Lips appears normal, oropharynx mucosa, tongue, posterior pharynx appear normal  Neck: neck appears normal, no masses, normal ROM, no thyromegaly, no JVD  CVS: S1-S2 clear, no murmur rubs or gallops, no LE edema, normal pedal pulses  Respiratory:  B crackles, tachypnea into the 30s but WOB much better now that he is on BIPAP Abdomen: soft nontender, nondistended, normal bowel sounds, no hepatosplenomegaly, no hernias  Musculoskeletal: : LLE dressing is C/D/I Neuro: Cranial nerves II-XII intact, strength, sensation, reflexes Psych: judgement  and insight appear normal, stable mood and affect, mental status Skin: no rashes or lesions or ulcers, no induration or nodules    Data reviewed:  I have personally reviewed following labs and imaging studies Labs:  CBC:  Recent Labs Lab 10/21/16 0926  WBC 13.3*  HGB 12.5*  HCT 37.2*  MCV 100.0  PLT 321    Basic Metabolic Panel:  Recent Labs Lab 10/21/16 0926  NA 140  K 4.1  CL 104  CO2 26  GLUCOSE 104*  BUN 10  CREATININE 0.84  CALCIUM 9.1   GFR Estimated Creatinine Clearance: 95.1 mL/min (by C-G formula based on SCr of 0.84 mg/dL). Liver Function Tests: No results for input(s): AST, ALT, ALKPHOS, BILITOT, PROT, ALBUMIN in the last 168 hours. No results for input(s): LIPASE, AMYLASE in the last 168 hours. No results for input(s): AMMONIA in the last 168 hours. Coagulation profile No results for input(s):  INR, PROTIME in the last 168 hours.  Cardiac Enzymes: No results for input(s): CKTOTAL, CKMB, CKMBINDEX, TROPONINI in the last 168 hours. BNP: Invalid input(s): POCBNP CBG: No results for input(s): GLUCAP in the last 168 hours. D-Dimer No results for input(s): DDIMER in the last 72 hours. Hgb A1c No results for input(s): HGBA1C in the last 72 hours. Lipid Profile No results for input(s): CHOL, HDL, LDLCALC, TRIG, CHOLHDL, LDLDIRECT in the last 72 hours. Thyroid function studies No results for input(s): TSH, T4TOTAL, T3FREE, THYROIDAB in the last 72 hours.  Invalid input(s): FREET3 Anemia work up No results for input(s): VITAMINB12, FOLATE, FERRITIN, TIBC, IRON, RETICCTPCT in the last 72 hours. Urinalysis No results found for: COLORURINE, APPEARANCEUR, LABSPEC, PHURINE, GLUCOSEU, HGBUR, BILIRUBINUR, KETONESUR, PROTEINUR, UROBILINOGEN, NITRITE, LEUKOCYTESUR   Microbiology Recent Results (from the past 240 hour(s))  Surgical pcr screen     Status: None   Collection Time: 10/21/16 10:06 AM  Result Value Ref Range Status   MRSA, PCR NEGATIVE NEGATIVE Final   Staphylococcus aureus NEGATIVE NEGATIVE Final    Comment: (NOTE) The Xpert SA Assay (FDA approved for NASAL specimens in patients 46 years of age and older), is one component of a comprehensive surveillance program. It is not intended to diagnose infection nor to guide or monitor treatment.        Inpatient Medications:   Scheduled Meds: . aspirin EC  81 mg Oral Daily  . docusate sodium  100 mg Oral BID  . furosemide  20 mg Intravenous STAT  . HYDROmorphone      . metoprolol succinate  12.5 mg Oral Daily  . simvastatin  40 mg Oral q1800  . terbinafine   Topical Daily   Continuous Infusions: .  ceFAZolin (ANCEF) IV Stopped (10/22/16 0304)  . methocarbamol (ROBAXIN)  IV       Radiological Exams on Admission: Dg Tibia/fibula Left  Result Date: 10/21/2016 CLINICAL DATA:  LEFT tibial fracture, surgery EXAM:  LEFT TIBIA AND FIBULA - 2 VIEW; DG C-ARM 61-120 MIN COMPARISON:  10/12/2016 FLUOROSCOPY TIME:  2 minutes 29 seconds Images obtained: 4 FINDINGS: Diffuse osseous demineralization. IM nail with proximal and distal locking screws has been placed across an oblique distal LEFT tibial diaphyseal fracture. Knee and ankle joint alignments normal. Previously identified oblique proximal and distal LEFT fibular diaphyseal fractures are less well visualized on submitted images. IMPRESSION: Post nailing of a reduced distal LEFT tibial diaphyseal fracture. Oblique fractures of the proximal and distal LEFT fibular diaphysis. Electronically Signed   By: Ulyses Southward M.D.   On: 10/21/2016 15:31  Dg Chest Port 1 View  Result Date: 10/22/2016 CLINICAL DATA:  Oxygen desaturation. EXAM: PORTABLE CHEST 1 VIEW COMPARISON:  None. FINDINGS: Cardiac enlargement with pulmonary vascular congestion. Bilateral interstitial and perihilar airspace disease, more prominent on the right. This likely represents diffuse pulmonary edema. Multifocal pneumonia could also have this appearance. No blunting of costophrenic angles. No pneumothorax. Mediastinal contours appear intact. Calcification of the aorta. Degenerative changes in the spine and shoulders. IMPRESSION: Cardiac enlargement with pulmonary vascular congestion and bilateral airspace and interstitial infiltrates, likely due to edema. Electronically Signed   By: Burman Nieves M.D.   On: 10/22/2016 04:15   Dg C-arm 1-60 Min  Result Date: 10/21/2016 CLINICAL DATA:  LEFT tibial fracture, surgery EXAM: LEFT TIBIA AND FIBULA - 2 VIEW; DG C-ARM 61-120 MIN COMPARISON:  10/12/2016 FLUOROSCOPY TIME:  2 minutes 29 seconds Images obtained: 4 FINDINGS: Diffuse osseous demineralization. IM nail with proximal and distal locking screws has been placed across an oblique distal LEFT tibial diaphyseal fracture. Knee and ankle joint alignments normal. Previously identified oblique proximal and distal  LEFT fibular diaphyseal fractures are less well visualized on submitted images. IMPRESSION: Post nailing of a reduced distal LEFT tibial diaphyseal fracture. Oblique fractures of the proximal and distal LEFT fibular diaphysis. Electronically Signed   By: Ulyses Southward M.D.   On: 10/21/2016 15:31    Impression/Recommendations Principal Problem:   Displaced comminuted fracture of shaft of left tibia, initial encounter for closed fracture Active Problems:   Left tibial fracture   Acute respiratory failure with hypoxia (HCC)   Flash pulmonary edema (HCC)  1. TIB FIB fx - POD #1 ORIF 2. Flash pulmonary edema causing acute hypoxic resp failure - 1. Stabilized with BIPAP 2. SBP running 120s-160s 1. Will try  IV lasix 2. And DC fluids. 3. DDX for cause includes: 1. PE 1. Possible given surgery yesterday, on SCDs only for DVT ppx 2. Will check CTA to rule this out (D.Dimer not surprisingly is elevated wether due to surgery vs PE and cannot be used to R/O PE). 2. ACS  1. although less likely given he is having absolutely no CP, no referred pain at this time 2. Did give Dr. Santiago Glad a call, he reviewed the new EKG and did not feel we should call STEMI at this time 3. Trop ordered 4. Tele monitor 3. Fluid overload - 1. Is positive net 1.4L since admission 2. see plan for stopping IVF and trying small amount of lasix as above 4. Other possible causes include valvular (acute aortic or acute mitral regurg), stress cardiomyopathy ([place]), Multifocal PNA 1. Will order 2d echo for today 2. Will order procalcitonin 4. BNP pending 5. Further work up and treatment based on results of above tests.   Thank you for this consultation.  Our [facility] hospitalist team will follow the patient with you.   Time Spent: 110 min  Cayleigh Paull M. D.O. [facility] 10/22/2016, 4:42 AM

## 2016-10-22 NOTE — Progress Notes (Signed)
Called by charge RN about patient being hypoxic. RT was at the bedside when I arrived, patient was on the NRB 15L sats were in the mid 70s, RR in the upper 30s, patient was labored, fingertips and lips were cyanotic, + crackles thorough out, + frothy pink sputum that patient has been coughing and spitting up.   RN paged ORTHO PA on call, I ordered the following: ABG, BIPAP, CXR, labs (BMP, BNP, CBC, D-DIMER, LACTIC, TROPONIN), EKG, and transfer to SDU.  ABG was critical, PaO2 of 45 and oxygen saturation of 75.    ORTHO PA did speak with TRH MD on call, MD did meet [place] in transit to SDU, wanted CARDS Fellow to evaluate EKG for STEMI rule out, patient was not having any CP, denied pain.    Patient taken to 65M, per CARDS fellow, not STEMI criteria.  Upon arrival to 65M, placed on BIPAP and Lasix   IV given.   I was called away to a medical emergency, MD remained at bedside.   F/U: I called at [phone] and [phone], patient did improve on BIPAP. Will follow as needed  End Time 430

## 2016-10-22 NOTE — Progress Notes (Signed)
CRITICAL VALUE ALERT  Critical Value:  Troponin 5.00  &  Lactic Acid 2.4  Date & Time Notied:  10/22/16 @ 0553  Provider Notified: Laban Emperor, DO  Orders Received/Actions taken: Orders for heparin per pharmacy consult

## 2016-10-22 NOTE — Progress Notes (Signed)
rec'd pt from 5 N pt on NRB spo2 85 placed on Bipap  pts spo2 improving already will continue to monitor progress

## 2016-10-22 NOTE — Op Note (Signed)
NAME:  Eduardo [name], Eduardo [name]                   ACCOUNT NO.:  MEDICAL RECORD NO.:  [id number]  LOCATION:                                 FACILITY:  PHYSICIAN:  Harvie Junior, M.D.   DATE OF BIRTH:  1965-07-30  DATE OF PROCEDURE:  10/21/2016 DATE OF DISCHARGE:                              OPERATIVE REPORT   PREOPERATIVE DIAGNOSIS:  Displaced tib-fib fracture, left.  POSTOPERATIVE DIAGNOSIS:  Displaced tib-fib fracture, left.  PROCEDURES: 1. Intramedullary rodding of left tibia fracture with Biomet     intramedullary nail 9 x 33 cm with 2 proximal locks and 3 distal     locks. 2. Interpretation of multiple intraoperative fluoroscopic images.  SURGEON:  Harvie Junior, MD.  ASSIST:  Marshia Ly, PA.  ANESTHESIA:  General.  BRIEF HISTORY:  [name] is a 51 year old male with long history and significant complaints of falling and twisting his left leg.  He suffered a displaced tibia fractures who presented to the office where he was noted to have a displaced tibia fracture.  We talked about treatment options and felt that intramedullary rodding would be appropriate, and he was brought to the operating room when the OR time is available for intramedullary fixation.  DESCRIPTION OF PROCEDURE:  The patient was taken to the operating room and after adequate anesthesia was obtained with general anesthetic, the patient was placed supine on the operating table.  The left leg was prepped and draped in usual sterile fashion.  Following this, the leg was elevated and blood tourniquet was inflated to 300 mmHg.  Following this, a small incision was made from the proximal portion down and the patellar tendon was retracted laterally.  A guidewire was placed in the top portion of the tibial plateau and the central portion of the tibia. This was advanced and then over-reamed with a 12-mm introductory reamer. Following this, a guidewire was placed down the central portion of the canal and the  down to the ankle.  We measured the appropriate length for the rod and sequential reaming was then done up to 10.5 mm and a 9 mm x 33 mm rod was chosen and advanced into the knee.  It was locked proximally with the jig and fluoro to assess the length of the rod and then the length of the screws.  Distally, it was locked with a freehand technique x3 because of the distal nature of the fracture.  Once this was done, the wounds were copiously and thoroughly irrigated, suctioned dry.  Final check was made with fluoro for the alignment of the fracture, alignment of the screws, and length of screws.  Once this was all acceptable, the tourniquet let down.  All bleeding was controlled with electrocautery.  The wounds were irrigated, suctioned dry, closed in layers.  Sterile compressive dressing was applied as well as a posterior splint, and the patient was taken to the recovery room where he was noted to be in satisfactory condition.  Estimated blood loss for procedure was minimal.     Harvie Junior, M.D.     Ranae Plumber  D:  10/21/2016  T:  10/21/2016  Job:  [id number]

## 2016-10-22 NOTE — Progress Notes (Signed)
TEAM 1 - Stepdown/ICU TEAM  Eduardo JACKSON  [name]:[id number] DOB: 1965-10-12 DOA: 10/21/2016 PCP: Patient, No Pcp Per    Brief Narrative:  51 y.o. male with h/o MI in July 2008 who was POD from ORIF of L tib/fib fx when he had CP and Cardiology was consulted.  At the time of Dr. Earmon Phoenix evaluation his pain had resolved.  That night/early next morning at about 4am he was awoken from sleep by sudden onset severe SOB, and a cough productive of pink frothy sputum.  His O2 sat dropped, and his was in 150s sinus.  He denied CP, jaw pain, left arm pain.  EKG noted ST changes in 2,3,AVF.  Cards fellow didn't feel it was a STEMI.  CXR showed pulmonary vascular congestion and pulmonary edema.  His respiratory status was stabilized with BIPAP.  Subjective: Pt was seen earlier today by my partner, and has been in the cath lab all afternoon.  Given the complex nature of his current medical issues, TRH will assume the attending role for the patient at this time.  Should signif cardiac issues be discovered in the cath lab, perhaps the Cardiolgoy team will assume attending role.      Assessment & Plan:  TIB FIB fx - S/P ORIF Care as per Ortho  Flash pulmonary edema > acute hypoxic resp failure CTa negative for PE  Elevated troponin - known CAD Cards following and took pt to cath lab today   DVT prophylaxis: heparin gtt Code Status: FULL CODE Family Communication:  Disposition Plan:   Consultants:  Cardiology Ortho  Antimicrobials:  Cefazolin 10/15 >  Objective: Blood pressure 131/74, pulse (!) 102, temperature 98.8 F (37.1 C), temperature source Oral, resp. rate (!) 22, height  (1.702 m), weight 77.3 kg (170 lb 6.4 oz), SpO2 95 %.  Intake/Output Summary (Last 24 hours) at 10/22/16 1737 Last data filed at 10/22/16 1200  Gross per 24 hour  Intake           243.33 ml  Output              875 ml  Net          -631.67 ml   Filed Weights   10/21/16 1759 10/22/16 0418    Weight: 75.1 kg (165 lb 8 oz) 77.3 kg (170 lb 6.4 oz)    Examination: Pt was seen for a f/u visit.    CBC:  Recent Labs Lab 10/21/16 0926 10/22/16 0416  WBC 13.3* 22.3*  HGB 12.5* 13.2  HCT 37.2* 38.6*  MCV 100.0 101.0*  PLT 321 361   Basic Metabolic Panel:  Recent Labs Lab 10/21/16 0926 10/22/16 0416  NA 140 133*  K 4.1 4.3  CL 104 97*  CO2 26 26  GLUCOSE 104* 193*  BUN 10 10  CREATININE 0.84 0.85  CALCIUM 9.1 8.7*   GFR: Estimated Creatinine Clearance: 94 mL/min (by C-G formula based on SCr of 0.85 mg/dL).  Liver Function Tests: No results for input(s): AST, ALT, ALKPHOS, BILITOT, PROT, ALBUMIN in the last 168 hours. No results for input(s): LIPASE, AMYLASE in the last 168 hours. No results for input(s): AMMONIA in the last 168 hours.  Coagulation Profile:  Recent Labs Lab 10/22/16 1118  INR 1.03    Cardiac Enzymes:  Recent Labs Lab 10/22/16 0416  TROPONINI 5.00*    Recent Results (from the past 240 hour(s))  Surgical pcr screen     Status: None   Collection Time: 10/21/16  10:06 AM  Result Value Ref Range Status   MRSA, PCR NEGATIVE NEGATIVE Final   Staphylococcus aureus NEGATIVE NEGATIVE Final    Comment: (NOTE) The Xpert SA Assay (FDA approved for NASAL specimens in patients 11 years of age and older), is one component of a comprehensive surveillance program. It is not intended to diagnose infection nor to guide or monitor treatment.   MRSA PCR Screening     Status: None   Collection Time: 10/22/16  4:23 AM  Result Value Ref Range Status   MRSA by PCR NEGATIVE NEGATIVE Final    Comment:        The GeneXpert MRSA Assay (FDA approved for NASAL specimens only), is one component of a comprehensive MRSA colonization surveillance program. It is not intended to diagnose MRSA infection nor to guide or monitor treatment for MRSA infections.      Scheduled Meds: . [START ON 10/23/2016] aspirin  81 mg Oral Pre-Cath  . [MAR Hold]  aspirin EC  81 mg Oral Daily  . [MAR Hold] docusate sodium  100 mg Oral BID  . [MAR Hold] furosemide  20 mg Intravenous STAT  . [MAR Hold] metoprolol succinate  12.5 mg Oral Daily  . [MAR Hold] simvastatin  40 mg Oral q1800  . sodium chloride flush  3 mL Intravenous Q12H  . [MAR Hold] terbinafine   Topical Daily     LOS: 1 day    Lonia Blood, MD [facility] Office  [phone] Pager - Text Page per Amion as per below:  On-Call/Text Page:      [email or web address]      password [email or web address]  If 7PM-7AM, please contact night-coverage [email or web address] Password [facility] 10/22/2016, 5:37 PM

## 2016-10-22 NOTE — Progress Notes (Addendum)
Progress Note  Patient Name: Eduardo [name] Date of Encounter: 10/22/2016  Primary Cardiologist: Excell Seltzer  Subjective   No chest pain or dyspnea this am.   Inpatient Medications    Scheduled Meds: . aspirin EC  81 mg Oral Daily  . docusate sodium  100 mg Oral BID  . furosemide  20 mg Intravenous STAT  . metoprolol succinate  12.5 mg Oral Daily  . simvastatin  40 mg Oral q1800  . terbinafine   Topical Daily   Continuous Infusions: .  ceFAZolin (ANCEF) IV Stopped (10/22/16 0304)  . heparin 1,200 Units/hr (10/22/16 0644)  . methocarbamol (ROBAXIN)  IV     PRN Meds: acetaminophen **OR** acetaminophen, bisacodyl, HYDROmorphone (DILAUDID) injection, methocarbamol **OR** methocarbamol (ROBAXIN)  IV, ondansetron **OR** ondansetron (ZOFRAN) IV, oxyCODONE, polyethylene glycol, zolpidem   Vital Signs    Vitals:   10/22/16 0727 10/22/16 0800 10/22/16 0826 10/22/16 0909  BP: 114/84 104/78 118/78 101/75  Pulse: (!) 125 (!) 116 (!) 121 (!) 117  Resp: 19 18 (!) 25 (!) 23  Temp: 98.2 F (36.8 C)     TempSrc: Oral     SpO2: 99% 99% 92% 93%  Weight:      Height:        Intake/Output Summary (Last 24 hours) at 10/22/16 0959 Last data filed at 10/22/16 0650  Gross per 24 hour  Intake          1843.33 ml  Output              925 ml  Net           918.33 ml   Filed Weights   10/21/16 1759 10/22/16 0418  Weight: 165 lb 8 oz (75.1 kg) 170 lb 6.4 oz (77.3 kg)    Telemetry    Sinus tach, rate 120 - Personally Reviewed  ECG    Sinus tach, subtle ST and T wave changes diffusely- Personally Reviewed  Physical Exam   GEN: No acute distress.   Neck: No JVD Cardiac: Regular, tachy. no murmurs, rubs, or gallops.  Respiratory: Clear to auscultation bilaterally. GI: Soft, nontender, non-distended  MS:  No LE edema; No deformity. Neuro:  Nonfocal  Psych: Normal affect   Labs    Chemistry Recent Labs Lab 10/21/16 0926 10/22/16 0416  NA 140 133*  K 4.1 4.3  CL 104 97*   CO2 26 26  GLUCOSE 104* 193*  BUN 10 10  CREATININE 0.84 0.85  CALCIUM 9.1 8.7*  GFRNONAA >60 >60  GFRAA >60 >60  ANIONGAP 10 10     Hematology Recent Labs Lab 10/21/16 0926 10/22/16 0416  WBC 13.3* 22.3*  RBC 3.72* 3.82*  HGB 12.5* 13.2  HCT 37.2* 38.6*  MCV 100.0 101.0*  MCH 33.6 34.6*  MCHC 33.6 34.2  RDW 12.0 11.9  PLT 321 361    Cardiac Enzymes Recent Labs Lab 10/22/16 0416  TROPONINI 5.00*   No results for input(s): TROPIPOC in the last 168 hours.   BNP Recent Labs Lab 10/22/16 0417  BNP 233.5*     DDimer  Recent Labs Lab 10/22/16 0416  DDIMER 11.13*     Radiology    Dg Tibia/fibula Left  Result Date: 10/21/2016 CLINICAL DATA:  LEFT tibial fracture, surgery EXAM: LEFT TIBIA AND FIBULA - 2 VIEW; DG C-ARM 61-120 MIN COMPARISON:  10/12/2016 FLUOROSCOPY TIME:  2 minutes 29 seconds Images obtained: 4 FINDINGS: Diffuse osseous demineralization. IM nail with proximal and distal locking screws has been placed  across an oblique distal LEFT tibial diaphyseal fracture. Knee and ankle joint alignments normal. Previously identified oblique proximal and distal LEFT fibular diaphyseal fractures are less well visualized on submitted images. IMPRESSION: Post nailing of a reduced distal LEFT tibial diaphyseal fracture. Oblique fractures of the proximal and distal LEFT fibular diaphysis. Electronically Signed   By: Ulyses Southward M.D.   On: 10/21/2016 15:31   Dg Chest Port 1 View  Result Date: 10/22/2016 CLINICAL DATA:  Oxygen desaturation. EXAM: PORTABLE CHEST 1 VIEW COMPARISON:  None. FINDINGS: Cardiac enlargement with pulmonary vascular congestion. Bilateral interstitial and perihilar airspace disease, more prominent on the right. This likely represents diffuse pulmonary edema. Multifocal pneumonia could also have this appearance. No blunting of costophrenic angles. No pneumothorax. Mediastinal contours appear intact. Calcification of the aorta. Degenerative changes in  the spine and shoulders. IMPRESSION: Cardiac enlargement with pulmonary vascular congestion and bilateral airspace and interstitial infiltrates, likely due to edema. Electronically Signed   By: Burman Nieves M.D.   On: 10/22/2016 04:15   Dg C-arm 1-60 Min  Result Date: 10/21/2016 CLINICAL DATA:  LEFT tibial fracture, surgery EXAM: LEFT TIBIA AND FIBULA - 2 VIEW; DG C-ARM 61-120 MIN COMPARISON:  10/12/2016 FLUOROSCOPY TIME:  2 minutes 29 seconds Images obtained: 4 FINDINGS: Diffuse osseous demineralization. IM nail with proximal and distal locking screws has been placed across an oblique distal LEFT tibial diaphyseal fracture. Knee and ankle joint alignments normal. Previously identified oblique proximal and distal LEFT fibular diaphyseal fractures are less well visualized on submitted images. IMPRESSION: Post nailing of a reduced distal LEFT tibial diaphyseal fracture. Oblique fractures of the proximal and distal LEFT fibular diaphysis. Electronically Signed   By: Ulyses Southward M.D.   On: 10/21/2016 15:31    Cardiac Studies     Patient Profile     51 y.o. male with CAD/prior MI/prior stenting of the Circumflex in 2008, tobacco abuse admitted after surgery for left tib/fib fracture. Pt with chest pain post op and was seen by Dr. Excell Seltzer. No EKG changes so plans for outpatient stress test. He had increased SOB last night around 4 am and reported pulmonary edema. Troponin elevated at 5.0. Only c/o dyspnea, no chest pain.   Assessment & Plan    1. Elevated troponin in setting of flash pulmonary edema in pt with known CAD and ongoing tobacco abuse: High probability of ACS. Troponin is now 5.0 at 4am. EKG with diffuse ST changes and T wave changes. I agree that he is also high risk for PE. Plans for chest CTA today to exclude PE. He will need a cardiac cath later today to exclude obstructive CAD. I have discussed this with Dr. Luiz Blare and his ok with anti-platelet therapy and anti-coagulation. He is on IV  heparin this am. He is having no chest pain currently. Risks and benefits of cath reviewed with pt and he agrees to proceed.    For questions or updates, please contact [facility] Please consult [email or web address] for contact info under Cardiology/STEMI.      Signed, Verne Carrow, MD  10/22/2016, 9:59 AM

## 2016-10-22 NOTE — Progress Notes (Signed)
PT Cancellation Note  Patient Details Name: Eduardo [name] MRN: [id number] DOB: 1965-08-01   Cancelled Treatment:    Reason Eval/Treat Not Completed: Medical issues which prohibited therapy. Pt transferred to 89M this AM due to hypoxia. Pt currently on bipap with trop of 5.0. Pt undergoing testing to r/o PE. PT to hold eval today.   Ilda Foil 10/22/2016, 9:43 AM

## 2016-10-22 NOTE — Interval H&P Note (Signed)
Cath Lab Visit (complete for each Cath Lab visit)  Clinical Evaluation Leading to the Procedure:   ACS: Yes.    Non-ACS:    Anginal Classification: CCS III  Anti-ischemic medical therapy: Minimal Therapy (1 class of medications)  Non-Invasive Test Results: No non-invasive testing performed  Prior CABG: No previous CABG      History and Physical Interval Note:  10/22/2016 2:16 PM  Eduardo [name]  has presented today for surgery, with the diagnosis of cp  The various methods of treatment have been discussed with the patient and family. After consideration of risks, benefits and other options for treatment, the patient has consented to  Procedure(s): LEFT HEART CATH AND CORONARY ANGIOGRAPHY (N/A) as a surgical intervention .  The patient's history has been reviewed, patient examined, no change in status, stable for surgery.  I have reviewed the patient's chart and labs.  Questions were answered to the patient's satisfaction.     Lyn Records III

## 2016-10-22 NOTE — Progress Notes (Signed)
  Echocardiogram 2D Echocardiogram has been performed.  Eduardo [name] 10/22/2016, 2:40 PM

## 2016-10-22 NOTE — Progress Notes (Addendum)
ANTICOAGULATION CONSULT NOTE - Follow Up Consult  Pharmacy Consult for Heparin Indication: R/O PE/ACS  Allergies  Allergen Reactions  . Penicillins Itching    Patient Measurements: Height:  (170.2 cm) Weight: 167 lb 1.7 oz (75.8 kg) IBW/kg (Calculated) : 66.1  Assessment: 51 y.o. male with chest pain/SOB for heparin.  CT neg for PE, LHC shows 3v CAD and EF 20% and volume overloaded with plans to diurese and then determine re-vascularization plan.   Restart heparin drip 8hr after sheath out.    Goal of Therapy:  Heparin level 0.3-0.7 units/ml Monitor platelets by anticoagulation protocol: Yes   Plan:  Restart heparin gtt  1,400 units/hr Monitor daily heparin level, CBC, s/s of bleed   Leota Sauers Pharm.D. CPP, BCPS Clinical Pharmacist [phone] 10/22/2016 7:17 PM

## 2016-10-22 NOTE — Progress Notes (Signed)
ANTICOAGULATION CONSULT NOTE - Initial Consult  Pharmacy Consult for Heparin Indication: R/O PE/ACS  Allergies  Allergen Reactions  . Penicillins Itching    Patient Measurements: Height:  (170.2 cm) Weight: 170 lb 6.4 oz (77.3 kg) IBW/kg (Calculated) : 66.1  Vital Signs: Temp: 98.9 F (37.2 C) (10/16 0417) Temp Source: Oral (10/16 0417) BP: 106/81 (10/16 0515) Pulse Rate: 118 (10/16 0515)  Labs:  Recent Labs  10/21/16 0926 10/22/16 0416  HGB 12.5* 13.2  HCT 37.2* 38.6*  PLT 321 361  CREATININE 0.84 0.85  TROPONINI  --  5.00*    Estimated Creatinine Clearance: 94 mL/min (by C-G formula based on SCr of 0.85 mg/dL).   Medical History: Past Medical History:  Diagnosis Date  . Anxiety   . Dyspnea   . Myocardial infarction [facility])    July 2008    Medications:  Prescriptions Prior to Admission  Medication Sig Dispense Refill Last Dose  . aspirin EC 81 MG tablet Take 81 mg by mouth daily.   10/20/2016 at Unknown time  . HYDROcodone-acetaminophen (NORCO/VICODIN) 5-325 MG tablet Take 1-2 tablets by mouth every 6 (six) hours as needed. 15 tablet 0 Past Week at Unknown time  . Omega-3 Fatty Acids (FISH OIL PO) Take by mouth.   10/20/2016 at Unknown time    Assessment: 51 y.o. male with chest pain/SOB for heparin Goal of Therapy:  Heparin level 0.3-0.7 units/ml Monitor platelets by anticoagulation protocol: Yes   Plan:  Heparin 4000 units IV bolus, then start heparin 1200 units/hr Check heparin level in 6 hours.   Finleigh Cheong, Gary Fleet 10/22/2016,6:07 AM

## 2016-10-23 ENCOUNTER — Encounter (HOSPITAL_COMMUNITY): Payer: Self-pay | Admitting: Interventional Cardiology

## 2016-10-23 DIAGNOSIS — J9601 Acute respiratory failure with hypoxia: Secondary | ICD-10-CM

## 2016-10-23 DIAGNOSIS — I214 Non-ST elevation (NSTEMI) myocardial infarction: Secondary | ICD-10-CM

## 2016-10-23 DIAGNOSIS — J989 Respiratory disorder, unspecified: Secondary | ICD-10-CM

## 2016-10-23 DIAGNOSIS — I509 Heart failure, unspecified: Secondary | ICD-10-CM

## 2016-10-23 DIAGNOSIS — S82112A Displaced fracture of left tibial spine, initial encounter for closed fracture: Secondary | ICD-10-CM

## 2016-10-23 DIAGNOSIS — I25110 Atherosclerotic heart disease of native coronary artery with unstable angina pectoris: Secondary | ICD-10-CM

## 2016-10-23 DIAGNOSIS — J810 Acute pulmonary edema: Secondary | ICD-10-CM

## 2016-10-23 LAB — BASIC METABOLIC PANEL
Anion gap: 10 (ref 5–15)
BUN: 8 mg/dL (ref 6–20)
CHLORIDE: 95 mmol/L — AB (ref 101–111)
CO2: 26 mmol/L (ref 22–32)
Calcium: 8.5 mg/dL — ABNORMAL LOW (ref 8.9–10.3)
Creatinine, Ser: 0.77 mg/dL (ref 0.61–1.24)
GFR calc Af Amer: >60 mL/min (ref >60)
GFR calc non Af Amer: >60 mL/min (ref >60)
Glucose, Bld: 101 mg/dL — ABNORMAL HIGH (ref 65–99)
POTASSIUM: 3.5 mmol/L (ref 3.5–5.1)
Sodium: 131 mmol/L — ABNORMAL LOW (ref 135–145)

## 2016-10-23 LAB — CBC
HEMATOCRIT: 33.6 % — AB (ref 39.0–52.0)
HEMOGLOBIN: 11.8 g/dL — AB (ref 13.0–17.0)
MCH: 34.5 pg — AB (ref 26.0–34.0)
MCHC: 35.1 g/dL (ref 30.0–36.0)
MCV: 98.2 fL (ref 78.0–100.0)
Platelets: 301 10*3/uL (ref 150–400)
RBC: 3.42 MIL/uL — ABNORMAL LOW (ref 4.22–5.81)
RDW: 11.7 % (ref 11.5–15.5)
WBC: 17.6 10*3/uL — ABNORMAL HIGH (ref 4.0–10.5)

## 2016-10-23 LAB — MAGNESIUM: Magnesium: 1.6 mg/dL — ABNORMAL LOW (ref 1.7–2.4)

## 2016-10-23 LAB — HEPARIN LEVEL (UNFRACTIONATED)
Heparin Unfractionated: 0.26 IU/mL — ABNORMAL LOW (ref 0.30–0.70)
Heparin Unfractionated: 0.32 IU/mL (ref 0.30–0.70)

## 2016-10-23 MED ORDER — METOPROLOL SUCCINATE ER 25 MG PO TB24
25.0000 mg | ORAL_TABLET | Freq: Every day | ORAL | Status: DC
  Administered 2016-10-24 – 2016-10-27 (×4): 25 mg via ORAL
  Filled 2016-10-23 (×4): qty 1

## 2016-10-23 MED ORDER — POTASSIUM CHLORIDE CRYS ER 20 MEQ PO TBCR
20.0000 meq | EXTENDED_RELEASE_TABLET | Freq: Two times a day (BID) | ORAL | Status: DC
  Administered 2016-10-23 – 2016-10-27 (×9): 20 meq via ORAL
  Filled 2016-10-23 (×9): qty 1

## 2016-10-23 MED ORDER — FUROSEMIDE 10 MG/ML IJ SOLN
20.0000 mg | Freq: Two times a day (BID) | INTRAMUSCULAR | Status: DC
  Administered 2016-10-23 – 2016-10-25 (×4): 20 mg via INTRAVENOUS
  Filled 2016-10-23 (×4): qty 2

## 2016-10-23 MED ORDER — LISINOPRIL 2.5 MG PO TABS
2.5000 mg | ORAL_TABLET | Freq: Every day | ORAL | Status: DC
  Administered 2016-10-23 – 2016-10-27 (×5): 2.5 mg via ORAL
  Filled 2016-10-23 (×5): qty 1

## 2016-10-23 MED FILL — Lidocaine HCl Local Inj 2%: INTRAMUSCULAR | Qty: 10 | Status: AC

## 2016-10-23 NOTE — Progress Notes (Signed)
Progress Note  Patient Name: Eduardo [name] Date of Encounter: 10/23/2016  Primary Cardiologist: Excell Seltzerooper  Subjective   Feeling well this morning, no chest pain.   Inpatient Medications    Scheduled Meds: . aspirin EC  81 mg Oral Daily  . docusate sodium  100 mg Oral BID  . mouth rinse  15 mL Mouth Rinse BID  . metoprolol succinate  12.5 mg Oral Daily  . simvastatin  40 mg Oral q1800  . sodium chloride flush  3 mL Intravenous Q12H  . terbinafine   Topical Daily   Continuous Infusions: . sodium chloride    . heparin 1,500 Units/hr (10/23/16 0828)  . methocarbamol (ROBAXIN)  IV     PRN Meds: sodium chloride, [DISCONTINUED] acetaminophen **OR** acetaminophen, acetaminophen, bisacodyl, HYDROmorphone (DILAUDID) injection, methocarbamol **OR** methocarbamol (ROBAXIN)  IV, ondansetron **OR** ondansetron (ZOFRAN) IV, oxyCODONE, polyethylene glycol, sodium chloride flush, zolpidem   Vital Signs    Vitals:   10/23/16 0600 10/23/16 0700 10/23/16 0800 10/23/16 0810  BP: 133/74 (!) 160/69 (!) 152/77   Pulse: (!) 105 (!) 103 (!) 104   Resp: (!) 24 (!) 25 18   Temp:    (!) 100.6 F (38.1 C)  TempSrc:    Oral  SpO2: 95% 94% 98%   Weight:      Height:        Intake/Output Summary (Last 24 hours) at 10/23/16 0900 Last data filed at 10/23/16 0800  Gross per 24 hour  Intake            788.4 ml  Output             3105 ml  Net          -2316.6 ml   Filed Weights   10/21/16 1759 10/22/16 0418 10/22/16 1829  Weight: 165 lb 8 oz (75.1 kg) 170 lb 6.4 oz (77.3 kg) 167 lb 1.7 oz (75.8 kg)    Telemetry    ST - Personally Reviewed  ECG    N/a - Personally Reviewed  Physical Exam   General: Well developed, well nourished, male appearing in no acute distress. Head: Normocephalic, atraumatic.  Neck: Supple without bruits, JVD. Lungs:  Resp regular and unlabored, CTA. Heart: RRR, S1, S2, no S3, S4, or murmur; no rub. Abdomen: Soft, non-tender, non-distended with normoactive  bowel sounds. Extremities: Left leg in splint, no edema noted to RLE. Right radial cath site stable.  Neuro: Alert and oriented X 3. Moves all extremities spontaneously. Psych: Normal affect.  Labs    Chemistry Recent Labs Lab 10/21/16 0926 10/22/16 0416 10/23/16 0552  NA 140 133* 131*  K 4.1 4.3 3.5  CL 104 97* 95*  CO2 26 26 26   GLUCOSE 104* 193* 101*  BUN 10 10 8   CREATININE 0.84 0.85 0.77  CALCIUM 9.1 8.7* 8.5*  GFRNONAA >60 >60 >60  GFRAA >60 >60 >60  ANIONGAP 10 10 10      Hematology Recent Labs Lab 10/21/16 0926 10/22/16 0416 10/23/16 0552  WBC 13.3* 22.3* 17.6*  RBC 3.72* 3.82* 3.42*  HGB 12.5* 13.2 11.8*  HCT 37.2* 38.6* 33.6*  MCV 100.0 101.0* 98.2  MCH 33.6 34.6* 34.5*  MCHC 33.6 34.2 35.1  RDW 12.0 11.9 11.7  PLT 321 361 301    Cardiac Enzymes Recent Labs Lab 10/22/16 0416  TROPONINI 5.00*   No results for input(s): TROPIPOC in the last 168 hours.   BNP Recent Labs Lab 10/22/16 0417  BNP 233.5*  DDimer  Recent Labs Lab 10/22/16 0416  DDIMER 11.13*      Radiology    Dg Tibia/fibula Left  Result Date: 10/21/2016 CLINICAL DATA:  LEFT tibial fracture, surgery EXAM: LEFT TIBIA AND FIBULA - 2 VIEW; DG C-ARM 61-120 MIN COMPARISON:  10/12/2016 FLUOROSCOPY TIME:  2 minutes 29 seconds Images obtained: 4 FINDINGS: Diffuse osseous demineralization. IM nail with proximal and distal locking screws has been placed across an oblique distal LEFT tibial diaphyseal fracture. Knee and ankle joint alignments normal. Previously identified oblique proximal and distal LEFT fibular diaphyseal fractures are less well visualized on submitted images. IMPRESSION: Post nailing of a reduced distal LEFT tibial diaphyseal fracture. Oblique fractures of the proximal and distal LEFT fibular diaphysis. Electronically Signed   By: Ulyses Southward M.D.   On: 10/21/2016 15:31   Ct Angio Chest Pe W Or Wo Contrast  Result Date: 10/22/2016 CLINICAL DATA:  Shortness of  breath. Status post ORIF left lower leg. EXAM: CT ANGIOGRAPHY CHEST WITH CONTRAST TECHNIQUE: Multidetector CT imaging of the chest was performed using the standard protocol during bolus administration of intravenous contrast. Multiplanar CT image reconstructions and MIPs were obtained to evaluate the vascular anatomy. CONTRAST:  100 mL Isovue 370 COMPARISON:  None. FINDINGS: Cardiovascular: Satisfactory opacification of the pulmonary arteries to the segmental level. No evidence of pulmonary embolism. Normal heart size. No pericardial effusion. Thoracic aorta is normal in caliber. Mediastinum/Nodes: No enlarged mediastinal, hilar, or axillary lymph nodes. Thyroid gland, trachea, and esophagus demonstrate no significant findings. Lungs/Pleura: Small bilateral pleural effusions. Bibasilar atelectasis. Bilateral perihilar patchy airspace disease. More consolidated airspace disease in the posterior segment of the right upper lobe. Upper Abdomen: No acute upper abdominal abnormality. Musculoskeletal: No acute osseous abnormality. No lytic or sclerotic osseous lesion. Review of the MIP images confirms the above findings. IMPRESSION: 1. No evidence of pulmonary embolus. 2. Pulmonary edema. 3. More consolidated airspace disease in the posterior segment of the right upper lobe concerning for superimposed pneumonia. Electronically Signed   By: Elige Ko   On: 10/22/2016 11:08   Dg Chest Port 1 View  Result Date: 10/22/2016 CLINICAL DATA:  Oxygen desaturation. EXAM: PORTABLE CHEST 1 VIEW COMPARISON:  None. FINDINGS: Cardiac enlargement with pulmonary vascular congestion. Bilateral interstitial and perihilar airspace disease, more prominent on the right. This likely represents diffuse pulmonary edema. Multifocal pneumonia could also have this appearance. No blunting of costophrenic angles. No pneumothorax. Mediastinal contours appear intact. Calcification of the aorta. Degenerative changes in the spine and shoulders.  IMPRESSION: Cardiac enlargement with pulmonary vascular congestion and bilateral airspace and interstitial infiltrates, likely due to edema. Electronically Signed   By: Burman Nieves M.D.   On: 10/22/2016 04:15   Dg C-arm 1-60 Min  Result Date: 10/21/2016 CLINICAL DATA:  LEFT tibial fracture, surgery EXAM: LEFT TIBIA AND FIBULA - 2 VIEW; DG C-ARM 61-120 MIN COMPARISON:  10/12/2016 FLUOROSCOPY TIME:  2 minutes 29 seconds Images obtained: 4 FINDINGS: Diffuse osseous demineralization. IM nail with proximal and distal locking screws has been placed across an oblique distal LEFT tibial diaphyseal fracture. Knee and ankle joint alignments normal. Previously identified oblique proximal and distal LEFT fibular diaphyseal fractures are less well visualized on submitted images. IMPRESSION: Post nailing of a reduced distal LEFT tibial diaphyseal fracture. Oblique fractures of the proximal and distal LEFT fibular diaphysis. Electronically Signed   By: Ulyses Southward M.D.   On: 10/21/2016 15:31    Cardiac Studies   Cath: 10/22/16  Conclusion    Acute  on chronic systolic heart failure with LVEDP 35 mmHg. Leftventriculography was not performed but echo done earlier today demonstrated EF less than 20%.  Severe three-vessel coronary disease with total occlusion of the proximal circumflex and the previously stented segment. Faint left to left collaterals are noted.  Severe diffuse disease in the proximal and mid LAD.  Severe mid RCA stenosis.  RECOMMENDATIONS:   Aggressive management of decompensated heart failure. Limit fluid administration.  IV Lasix 40 mg given in the cath lab.  Guideline directed therapy for systolic heart failure.  After heart failure is better controlled, consider revascularization options.   Discussed cessation of alcohol intake with the patient.   Echo: 10/22/16  Study Conclusions  - Left ventricle: The cavity size was mildly dilated. Wall   thickness was normal.  The estimated ejection fraction was 20%.   Diffuse hypokinesis. Features are consistent with a pseudonormal   left ventricular filling pattern, with concomitant abnormal   relaxation and increased filling pressure (grade 2 diastolic   dysfunction). - Aortic valve: There was no stenosis. - Mitral valve: There was mild regurgitation. - Right ventricle: The cavity size was normal. Systolic function   was mildly reduced. - Pulmonary arteries: No complete TR doppler jet so unable to   estimate PA systolic pressure. - Inferior vena cava: The vessel was normal in size. The   respirophasic diameter changes were in the normal range (>= 50%),   consistent with normal central venous pressure.  Impressions:  - Mildly dilated LV with EF 20%, diffuse hypokinesis. Moderate   diastolic dysfunction. Mild mitral regurgitation. Normal RV size   with mildly decreased systolic function.  Patient Profile     51 y.o. male with CAD/prior MI/prior stenting of the Circumflex in 2008, tobacco abuse admitted after surgery for left tib/fib fracture. Pt with chest pain post op and was seen by Dr. Excell Seltzer. No EKG changes so planned for outpatient stress test. He had increased SOB 10/16 around 4 am and reported pulmonary edema. Troponin elevated at 5.0. Underwent cardiac cath yesterday, 3v disease with EF of 20%.   Assessment & Plan    1. NSTEMI: Cathed by Dr. Katrinka Blazing yesterday with severe 3v disease, and echo noting EF of 20% with diffuse hypokinesis. LVEDP was 35, given 40mg  IV lasix post cath with 2L UOP. Breathing much improved this morning, resting comfortably in bed. TCTS consulted.  -- continue asa, statin, BB and IV heparin  2. Acute diastolic HF: EF 20% on echo. Did require the use of Bipap yesterday morning, now on [place]. LVEDP 35, given 40mg  IV lasix with significant improvement in respiratory status.  -- BB added yesterday, consider the addition of ACEi/ARB.  3. HTN: BP remains elevated, and tachycardiac on  telemetry -- increase Toprol to 25mg  daily  4. HL: on statin therapy  5. Left Tib/Fib fracture s/p IM rod: orthopedics following. OK for anticoagulation from there standpoint.   Signed, Laverda Page, NP  10/23/2016, 9:00 AM  Pager # [phone]   For questions or updates, please contact [facility] Please consult [email or web address] for contact info under Cardiology/STEMI. Daytime calls, contact the Day Call APP (6a-8a) or assigned team (Teams A-D) provider (7:30a - 5p). All other daytime calls (7:30-5p), contact the Card Master @ [phone].   Nighttime calls, contact the assigned APP (5p-8p) or MD (6:30p-8p). Overnight calls (8p-6a), contact the on call Fellow @ [phone].   Patient seen, examined. Available data reviewed. Agree with findings, assessment, and plan as outlined by Laverda Page, NP-C.  the patient is independently interviewed and examined. He is alert, oriented, and in no distress. JVP is not elevated. Lung fields are clear. Heart is tachycardic and regular without murmur or gallop. Abdomen is soft and nontender. The right leg is not edematous. The left leg is dressed after surgical repair of a tib/fib fracture.   I have personally reviewed the patient's cardiac catheterization films and echo studies. 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 He tells me that he has been drinking a gallon of [employer] every week.  Concerned that his tachycardia is at least in part related to alcohol withdrawal. We will place him on the non-ICU CIWA protocol.   Will add a low-dose ACE-I to his medical regimen and give additional scheduled IV lasix. Will add aldactone pending his tolerance of an ACE.   I think he ultimately would benefit from CABG, but he is very resistant to the idea of having CABG this soon after orthopedic surgery. Cardiac surgery consult is pending. I have stressed to him that if  revascularization is delayed too long, he will be at high-risk of further ischemic events which could include MI and death. He understands.   Tonny Bollman, M.D. 10/23/2016 3:30 PM

## 2016-10-23 NOTE — Progress Notes (Signed)
ANTICOAGULATION CONSULT NOTE - Follow Up Consult  Pharmacy Consult for Heparin Indication: R/O ACS  Patient Measurements: Height: 5\' 7"  (170.2 cm) Weight: 167 lb 1.7 oz (75.8 kg) IBW/kg (Calculated) : 66.1  Assessment: 51 y.o. male with chest pain/SOB for heparin.  CT neg for PE, LHC shows 3v CAD and EF 20% and volume overloaded with plans to diurese and then determine re-vascularization plan.   His heparin level returned subtherapeutic this morning at 0.26. Hgb 11.8, PLTs stable, no bleeding noted. Will increase the drip and recheck a heparin level in 6 hours.  Goal of Therapy:  Heparin level 0.3-0.7 units/ml Monitor platelets by anticoagulation protocol: Yes   Plan:  Increase heparin gtt to 1,500 units/hr Recheck heparin level at 1430 Monitor daily heparin level, CBC, s/sx of bleed   Nolen MuAustin J Lucas PharmD PGY1 Pharmacy Practice Resident 10/23/2016 8:07 AM Pager: [phone]

## 2016-10-23 NOTE — Progress Notes (Signed)
Subjective: 2 Days Post-Op Procedure(s) (LRB): INTRAMEDULLARY (IM) ROD TIBIA/FIBULA FRACTURE (Left) Patient reports pain as mild.  Denies chest pain or shortness of breath this morning. Does complain of some discomfort and stiffness in his left knee as would be expected.  Objective: Vital signs in last 24 hours: Temp:  [97.9 F (36.6 C)-100.6 F (38.1 C)] 100.6 F (38.1 C) (10/17 0810) Pulse Rate:  [0-252] 104 (10/17 0800) Resp:  [0-54] 18 (10/17 0800) BP: (101-160)/(65-84) 152/77 (10/17 0800) SpO2:  [0 %-98 %] 98 % (10/17 0800) FiO2 (%):  [35 %-55 %] 35 % (10/16 1830) Weight:  [75.8 kg (167 lb 1.7 oz)] 75.8 kg (167 lb 1.7 oz) (10/16 1829)  Intake/Output from previous day: 10/16 0701 - 10/17 0700 In: 774.4 [P.O.:480; I.V.:294.4] Out: 2705 [Urine:2705] Intake/Output this shift: Total I/O In: 14 [I.V.:14] Out: 400 [Urine:400]   Recent Labs  10/21/16 0926 10/22/16 0416 10/23/16 0552  HGB 12.5* 13.2 11.8*    Recent Labs  10/22/16 0416 10/23/16 0552  WBC 22.3* 17.6*  RBC 3.82* 3.42*  HCT 38.6* 33.6*  PLT 361 301    Recent Labs  10/21/16 0926 10/22/16 0416  NA 140 133*  K 4.1 4.3  CL 104 97*  CO2 26 26  BUN 10 10  CREATININE 0.84 0.85  GLUCOSE 104* 193*  CALCIUM 9.1 8.7*    Recent Labs  10/22/16 1118  INR 1.03  Left lower extremity exam: Posterior splint is intact. Dressing clean and dry. Moves toes actively. Good capillary refill in toes. Patient is alert and oriented.    Assessment/Plan: 2 Days Post-Op Procedure(s) (LRB): INTRAMEDULLARY (IM) ROD TIBIA/FIBULA FRACTURE (Left) Coronary artery disease cardiac cath done yesterday. Plan: We greatly appreciate internal medicine/hospitalist/cardiology help with Eduardo [name]. He can be anticoagulated without restrictions from an orthopedic view point. When able to get out of bed can start physical therapy for knee range of motion nonweightbearing on the left. As well as ambulation as tolerated. We will  follow the patient.  Eduardo [name] 10/23/2016, 8:17 AM

## 2016-10-23 NOTE — Consult Note (Signed)
[address]       [place] [address]             [phone]        Eduardo [name] [facility] Medical Record #[id number] Date of Birth: September 14, 1965  Referring: Dr Katrinka Blazing Primary Care: Patient, No Pcp Per  Chief Complaint:   Respiratory distress    History of Present Illness:     Eduardo [name] is a 51 year old male patient with a past medical history of CAD involving pervious stenting of the circumflex artery in 2008, tobacco abuse, s/p left tibial and fibula fracture. The patient developed a tickle in his throat and lots of coughing post op and was seen by Dr. Excell Seltzer for an evaluation. He is adamant that it was not chest pain. He has some shortness of breath the night of 10/15 and reported pulmonary edema. Troponin was elevated at 5.0. Cardiac cath was performed yesterday which showed acute on chronic systolic heart failure with LVEDP of . An echo earlier that day showed EF of less than 20%. Severe three vessel CAD with total occlusion of the proximal circumflex and the previously stented segment. Faint left to left collaterals were noted. Severe diffuse disease in the proximal and mid LAD. Severe mid RCA stenosis. We have been consulted for possible revascularization.   Heart failure has been consulted to assist in management.    Current Activity/ Functional Status: Patient was independent with mobility/ambulation, transfers, ADL's, IADL's.   Zubrod Score: At the time of surgery this patient's most appropriate activity status/level should be described as: []     0    Normal activity, no symptoms [x]     1    Restricted in physical strenuous activity but ambulatory, able to do out light work []     2    Ambulatory and capable of self care, unable to do work activities, up and about                 more than 50%  Of the time                            []     3    Only limited self care, in bed greater than 50% of waking hours []     4    Completely disabled, no  self care, confined to bed or chair []     5    Moribund  Past Medical History:  Diagnosis Date  . Anxiety   . Dyspnea   . Myocardial infarction [facility])    July 2008    Past Surgical History:  Procedure Laterality Date  . ANKLE SURGERY    . CARDIAC CATHETERIZATION     stent placed 2008  . LEFT HEART CATH AND CORONARY ANGIOGRAPHY N/A 10/22/2016   Procedure: LEFT HEART CATH AND CORONARY ANGIOGRAPHY;  Surgeon: Lyn Records, MD;  Location: [facility] INVASIVE CV LAB;  Service: Cardiovascular;  Laterality: N/A;  . SHOULDER SURGERY    . TIBIA IM NAIL INSERTION Left 10/21/2016   Procedure: INTRAMEDULLARY (IM) ROD TIBIA/FIBULA FRACTURE;  Surgeon: Jodi Geralds, MD;  Location: [facility] OR;  Service: Orthopedics;  Laterality: Left;  . TONSILLECTOMY      History  Smoking Status  . Current Every Day Smoker  . Packs/day: 1.00  . Years: 40.00  . Types: Cigarettes  Smokeless Tobacco  . Never Used    History  Alcohol  Use  . Yes    Social History   Social History  . Marital status: Single    Spouse name: N/A  . Number of children: N/A  . Years of education: N/A   Occupational History  . Not on file.   Social History Main Topics  . Smoking status: Current Every Day Smoker    Packs/day: 1.00    Years: 40.00    Types: Cigarettes  . Smokeless tobacco: Never Used  . Alcohol use Yes  . Drug use: No  . Sexual activity: Not on file   Other Topics Concern  . Not on file   Social History Narrative  . No narrative on file    Allergies  Allergen Reactions  . Penicillins Itching    Current Facility-Administered Medications  Medication Dose Route Frequency Provider Last Rate Last Dose  . 0.9 %  sodium chloride infusion  250 mL Intravenous PRN Lyn Records, MD      . acetaminophen (TYLENOL) suppository 650 mg  650 mg Rectal Q6H PRN Marshia Ly, PA-C      . acetaminophen (TYLENOL) tablet 650 mg  650 mg Oral Q4H PRN Lyn Records, MD      . aspirin EC tablet 81 mg  81 mg Oral Daily  Marshia Ly, PA-C   81 mg at 10/23/16 0902  . bisacodyl (DULCOLAX) EC tablet 5 mg  5 mg Oral Daily PRN Marshia Ly, PA-C      . docusate sodium (COLACE) capsule 100 mg  100 mg Oral BID Marshia Ly, PA-C   100 mg at 10/23/16 4098  . heparin ADULT infusion 100 units/mL (25000 units/253mL sodium chloride 0.45%)  1,500 Units/hr Intravenous Continuous Drema Dallas, MD 15 mL/hr at 10/23/16 0828 1,500 Units/hr at 10/23/16 0828  . HYDROmorphone (DILAUDID) injection 0.5-1 mg  0.5-1 mg Intravenous Q3H PRN Marshia Ly, PA-C   1 mg at 10/22/16 0631  . MEDLINE mouth rinse  15 mL Mouth Rinse BID Lonia Blood, MD   15 mL at 10/23/16 0800  . methocarbamol (ROBAXIN) tablet 500 mg  500 mg Oral Q6H PRN Marshia Ly, PA-C   500 mg at 10/21/16 1614   Or  . methocarbamol (ROBAXIN) 500 mg in dextrose 5 % 50 mL IVPB  500 mg Intravenous Q6H PRN Marshia Ly, PA-C      . [START ON 10/24/2016] metoprolol succinate (TOPROL-XL) 24 hr tablet 25 mg  25 mg Oral Daily Laverda Page B, NP      . ondansetron (ZOFRAN) tablet 4 mg  4 mg Oral Q6H PRN Marshia Ly, PA-C       Or  . ondansetron [facility]) injection 4 mg  4 mg Intravenous Q6H PRN Marshia Ly, PA-C   4 mg at 10/23/16 1121  . oxyCODONE (Oxy IR/ROXICODONE) immediate release tablet 5-10 mg  5-10 mg Oral Q3H PRN Marshia Ly, PA-C   5 mg at 10/23/16 0520  . polyethylene glycol (MIRALAX / GLYCOLAX) packet 17 g  17 g Oral Daily PRN Marshia Ly, PA-C      . simvastatin (ZOCOR) tablet 40 mg  40 mg Oral q1800 Leone Brand, NP   40 mg at 10/22/16 2034  . sodium chloride flush (NS) 0.9 % injection 3 mL  3 mL Intravenous Q12H Lyn Records, MD   3 mL at 10/23/16 0903  . sodium chloride flush (NS) 0.9 % injection 3 mL  3 mL Intravenous PRN Lyn Records, MD      . terbinafine (  LAMISIL) 1 % cream   Topical Daily Jodi Geralds, MD      . zolpidem (AMBIEN) tablet 5 mg  5 mg Oral QHS PRN,MR X 1 Marshia Ly, PA-C   5 mg at 10/22/16 2037     Prescriptions Prior to Admission  Medication Sig Dispense Refill Last Dose  . aspirin EC 81 MG tablet Take 81 mg by mouth daily.   10/20/2016 at Unknown time  . HYDROcodone-acetaminophen (NORCO/VICODIN) 5-325 MG tablet Take 1-2 tablets by mouth every 6 (six) hours as needed. 15 tablet 0 Past Week at Unknown time  . Omega-3 Fatty Acids (FISH OIL PO) Take by mouth.   10/20/2016 at Unknown time    Family History  Problem Relation Age of Onset  . Mitral valve prolapse Mother   . Stroke Father   . Mitral valve prolapse Sister        mitral valve surgery     Review of Systems:  Pertinent items are noted in HPI.     Cardiac Review of Systems: Y or N  Chest Pain [    ]  Resting SOB [ Y  ] Exertional SOB  [ Y ]  Orthopnea [  ]   Pedal Edema [ Y  ]    Palpitations [ N ] Syncope  [  ]   Presyncope [   ]  General Review of Systems: [Y] = yes [  ]=no Constitional: recent weight change [  ]; anorexia [  ]; fatigue [  ]; nausea [  ]; night sweats [  ]; fever [Y ]; or chills [  ]                                                               Dental: poor dentition[  ]; Last Dentist visit:   Eye : blurred vision [  ]; diplopia [   ]; vision changes [  ];  Amaurosis fugax[  ]; Resp: cough [ Y ];  wheezing[ N ];  hemoptysis[  ]; shortness of breath[ Y ]; paroxysmal nocturnal dyspnea[  ]; dyspnea on exertion[Y  ]; or orthopnea[  ];  GI:  gallstones[  ], vomiting[N  ];  dysphagia[  ]; melena[  ];  hematochezia [  ]; heartburn[  ];   Hx of  Colonoscopy[  ]; GU: kidney stones [  ]; hematuria[  ];   dysuria [  ];  nocturia[  ];  history of     obstruction [ N ]; urinary frequency [  ]             Skin: rash, swelling[ Y ];, hair loss[  ];  peripheral edema[  ];  or itching[  ]; Musculosketetal: myalgias[  ];  joint swelling[  ];  joint erythema[  ];  joint pain[ Y ];  back pain[  ];  Heme/Lymph: bruising[  ];  bleeding[  ];  anemia[  ];  Neuro: TIA[ N ];  headaches[  ];  stroke[ N ];  vertigo[  ];   seizures[  ];   paresthesias[  ];  difficulty walking[  ];  Psych:depression[ N ]; anxiety[N  ];  Endocrine: diabetes[ N ];  thyroid dysfunction[N  ];  Immunizations: Flu [  ]; Pneumococcal[  ];  Other:  Physical  Exam: BP (!) 164/86   Pulse (!) 102   Temp (!) 100.6 F (38.1 C) (Oral)   Resp (!) 22   Ht 5\' 7"  (1.702 m)   Wt 170 lb 6.7 oz (77.3 kg)   SpO2 97%   BMI 26.69 kg/m    General appearance: alert, cooperative and no distress Resp: clear to auscultation bilaterally Cardio: sinua tachycardia, no murmur GI: soft, non-tender; bowel sounds normal; no masses,  no organomegaly Extremities: left lower leg wrapped with ace bandage      Recent Radiology Findings:   Dg Tibia/fibula Left  Result Date: 10/21/2016 CLINICAL DATA:  LEFT tibial fracture, surgery EXAM: LEFT TIBIA AND FIBULA - 2 VIEW; DG C-ARM 61-120 MIN COMPARISON:  10/12/2016 FLUOROSCOPY TIME:  2 minutes 29 seconds Images obtained: 4 FINDINGS: Diffuse osseous demineralization. IM nail with proximal and distal locking screws has been placed across an oblique distal LEFT tibial diaphyseal fracture. Knee and ankle joint alignments normal. Previously identified oblique proximal and distal LEFT fibular diaphyseal fractures are less well visualized on submitted images. IMPRESSION: Post nailing of a reduced distal LEFT tibial diaphyseal fracture. Oblique fractures of the proximal and distal LEFT fibular diaphysis. Electronically Signed   By: Ulyses Southward M.D.   On: 10/21/2016 15:31   Ct Angio Chest Pe W Or Wo Contrast  Result Date: 10/22/2016 CLINICAL DATA:  Shortness of breath. Status post ORIF left lower leg. EXAM: CT ANGIOGRAPHY CHEST WITH CONTRAST TECHNIQUE: Multidetector CT imaging of the chest was performed using the standard protocol during bolus administration of intravenous contrast. Multiplanar CT image reconstructions and MIPs were obtained to evaluate the vascular anatomy. CONTRAST:  100 mL Isovue 370 COMPARISON:  None.  FINDINGS: Cardiovascular: Satisfactory opacification of the pulmonary arteries to the segmental level. No evidence of pulmonary embolism. Normal heart size. No pericardial effusion. Thoracic aorta is normal in caliber. Mediastinum/Nodes: No enlarged mediastinal, hilar, or axillary lymph nodes. Thyroid gland, trachea, and esophagus demonstrate no significant findings. Lungs/Pleura: Small bilateral pleural effusions. Bibasilar atelectasis. Bilateral perihilar patchy airspace disease. More consolidated airspace disease in the posterior segment of the right upper lobe. Upper Abdomen: No acute upper abdominal abnormality. Musculoskeletal: No acute osseous abnormality. No lytic or sclerotic osseous lesion. Review of the MIP images confirms the above findings. IMPRESSION: 1. No evidence of pulmonary embolus. 2. Pulmonary edema. 3. More consolidated airspace disease in the posterior segment of the right upper lobe concerning for superimposed pneumonia. Electronically Signed   By: Elige Ko   On: 10/22/2016 11:08   Dg Chest Port 1 View  Result Date: 10/22/2016 CLINICAL DATA:  Oxygen desaturation. EXAM: PORTABLE CHEST 1 VIEW COMPARISON:  None. FINDINGS: Cardiac enlargement with pulmonary vascular congestion. Bilateral interstitial and perihilar airspace disease, more prominent on the right. This likely represents diffuse pulmonary edema. Multifocal pneumonia could also have this appearance. No blunting of costophrenic angles. No pneumothorax. Mediastinal contours appear intact. Calcification of the aorta. Degenerative changes in the spine and shoulders. IMPRESSION: Cardiac enlargement with pulmonary vascular congestion and bilateral airspace and interstitial infiltrates, likely due to edema. Electronically Signed   By: Burman Nieves M.D.   On: 10/22/2016 04:15   Dg C-arm 1-60 Min  Result Date: 10/21/2016 CLINICAL DATA:  LEFT tibial fracture, surgery EXAM: LEFT TIBIA AND FIBULA - 2 VIEW; DG C-ARM 61-120 MIN  COMPARISON:  10/12/2016 FLUOROSCOPY TIME:  2 minutes 29 seconds Images obtained: 4 FINDINGS: Diffuse osseous demineralization. IM nail with proximal and distal locking screws has been placed across an oblique  distal LEFT tibial diaphyseal fracture. Knee and ankle joint alignments normal. Previously identified oblique proximal and distal LEFT fibular diaphyseal fractures are less well visualized on submitted images. IMPRESSION: Post nailing of a reduced distal LEFT tibial diaphyseal fracture. Oblique fractures of the proximal and distal LEFT fibular diaphysis. Electronically Signed   By: Ulyses SouthwardMark  Boles M.D.   On: 10/21/2016 15:31     I have independently reviewed the above radiologic studies. CATH: Procedures   LEFT HEART CATH AND CORONARY ANGIOGRAPHY  Conclusion    Acute on chronic systolic heart failure with LVEDP 35 mmHg. Leftventriculography was not performed but echo done earlier today demonstrated EF less than 20%.  Severe three-vessel coronary disease with total occlusion of the proximal circumflex and the previously stented segment. Faint left to left collaterals are noted.  Severe diffuse disease in the proximal and mid LAD.  Severe mid RCA stenosis.  RECOMMENDATIONS:   Aggressive management of decompensated heart failure. Limit fluid administration.  IV Lasix 40 mg given in the cath lab.  Guideline directed therapy for systolic heart failure.  After heart failure is better controlled, consider revascularization options.   Discussed cessation of alcohol intake with the patient.    ECHO: Transthoracic Echocardiography  Patient:    Cherylin MylarGodfrey, Caydyn A MR #:       [id number] Study Date: 10/22/2016 Gender:     M Age:        51 Height:     170.2 cm Weight:     77.3 kg BSA:        1.93 m^2 Pt. Status: Room:   ATTENDING    Nicola PoliceMcclung, Jeffrey T  ADMITTING    Graves, John L  SONOGRAPHER  Nolon Rodony Brown, RDCS  Janene MadeiraDERING     Gardner, Jared M  REFERRING    Hillary BowGardner, Jared M   PERFORMING   [facility], Inpatient  cc:  ------------------------------------------------------------------- LV EF: 20%  ------------------------------------------------------------------- Indications:      CHF - 428.0.  ------------------------------------------------------------------- History:   PMH:   Myocardial infarction.  Risk factors:  Current tobacco use.  ------------------------------------------------------------------- Study Conclusions  - Left ventricle: The cavity size was mildly dilated. Wall   thickness was normal. The estimated ejection fraction was 20%.   Diffuse hypokinesis. Features are consistent with a pseudonormal   left ventricular filling pattern, with concomitant abnormal   relaxation and increased filling pressure (grade 2 diastolic   dysfunction). - Aortic valve: There was no stenosis. - Mitral valve: There was mild regurgitation. - Right ventricle: The cavity size was normal. Systolic function   was mildly reduced. - Pulmonary arteries: No complete TR doppler jet so unable to   estimate PA systolic pressure. - Inferior vena cava: The vessel was normal in size. The   respirophasic diameter changes were in the normal range (>= 50%),   consistent with normal central venous pressure.  Impressions:  - Mildly dilated LV with EF 20%, diffuse hypokinesis. Moderate   diastolic dysfunction. Mild mitral regurgitation. Normal RV size   with mildly decreased systolic function.  ------------------------------------------------------------------- Study data:   Study status:  Routine.  Study completion:  There were no complications.          Transthoracic echocardiography. M-mode, complete 2D, spectral Doppler, and color Doppler. Birthdate:  Patient birthdate: [birth year].  Age:  Patient is 51 yr old.  Sex:  Gender: male.    BMI: 26.7 kg/m^2.  Blood pressure: 106/76  Patient status:  Inpatient.  Study date:  Study date: 10/22/2016. Study time: 01:41 PM.  Location:  ICU/CCU  -------------------------------------------------------------------  ------------------------------------------------------------------- Left ventricle:  The cavity size was mildly dilated. Wall thickness was normal. The estimated ejection fraction was 20%. Diffuse hypokinesis. Features are consistent with a pseudonormal left ventricular filling pattern, with concomitant abnormal relaxation and increased filling pressure (grade 2 diastolic dysfunction).  ------------------------------------------------------------------- Aortic valve:   Trileaflet.  Doppler:   There was no stenosis. There was no regurgitation.  ------------------------------------------------------------------- Aorta:  Aortic root: The aortic root was normal in size. Ascending aorta: The ascending aorta was normal in size.  ------------------------------------------------------------------- Mitral valve:   Normal thickness leaflets .  Doppler:   There was no evidence for stenosis.   There was mild regurgitation.    Valve area by pressure half-time: 7.33 cm^2. Indexed valve area by pressure half-time: 3.8 cm^2/m^2.    Peak gradient (D): 3 mm Hg.   ------------------------------------------------------------------- Left atrium:  The atrium was normal in size.  ------------------------------------------------------------------- Right ventricle:  The cavity size was normal. Systolic function was mildly reduced.  ------------------------------------------------------------------- Pulmonic valve:    Structurally normal valve.   Cusp separation was normal.  Doppler:  Transvalvular velocity was within the normal range. There was no regurgitation.  ------------------------------------------------------------------- Tricuspid valve:   Doppler:  There was no significant regurgitation.  ------------------------------------------------------------------- Pulmonary artery:   No complete TR  doppler jet so unable to estimate PA systolic pressure.  ------------------------------------------------------------------- Right atrium:  The atrium was normal in size.  ------------------------------------------------------------------- Pericardium:  There was no pericardial effusion.  ------------------------------------------------------------------- Systemic veins: Inferior vena cava: The vessel was normal in size. The respirophasic diameter changes were in the normal range (>= 50%), consistent with normal central venous pressure.  ------------------------------------------------------------------- Measurements   Left ventricle                           Value          Reference  LV ID, ED, PLAX chordal        (H)       61.7  mm       43 - 52  LV ID, ES, PLAX chordal        (H)       55.3  mm       23 - 38  LV fx shortening, PLAX chordal (L)       10    %        >=29  LV PW thickness, ED                      7.36  mm       ----------  IVS/LV PW ratio, ED                      1.25           <=1.3  Stroke volume, 2D                        30    ml       ----------  Stroke volume/bsa, 2D                    16    ml/m^2   ----------  LV ejection fraction, 1-p A4C            46    %        ----------  LV end-diastolic volume, 2-p  135   ml       ----------  LV end-systolic volume, 2-p              93    ml       ----------  LV ejection fraction, 2-p                31    %        ----------  Stroke volume, 2-p                       42    ml       ----------  LV end-diastolic volume/bsa,             70    ml/m^2   ----------  2-p  LV end-systolic volume/bsa,              48    ml/m^2   ----------  2-p  Stroke volume/bsa, 2-p                   22    ml/m^2   ----------  LV e&', lateral                           4.68  cm/s     ----------  LV E/e&', lateral                         19.49          ----------  LV e&', medial                            5.33  cm/s      ----------  LV E/e&', medial                          17.11          ----------  LV e&', average                           5.01  cm/s     ----------  LV E/e&', average                         18.22          ----------    Ventricular septum                       Value          Reference  IVS thickness, ED                        9.19  mm       ----------    LVOT                                     Value          Reference  LVOT ID, S                               18    mm       ----------  LVOT area                                2.54  cm^2     ----------  LVOT peak velocity, S                    77.6  cm/s     ----------  LVOT mean velocity, S                    53.6  cm/s     ----------  LVOT VTI, S                              11.7  cm       ----------  LVOT peak gradient, S                    2     mm Hg    ----------    Aorta                                    Value          Reference  Aortic root ID, ED                       30    mm       ----------    Left atrium                              Value          Reference  LA ID, A-P, ES                           42    mm       ----------  LA ID/bsa, A-P                           2.18  cm/m^2   <=2.2  LA volume, S                             50    ml       ----------  LA volume/bsa, S                         26    ml/m^2   ----------  LA volume, ES, 1-p A4C                   47.5  ml       ----------  LA volume/bsa, ES, 1-p A4C               24.7  ml/m^2   ----------  LA volume, ES, 1-p A2C                   52.5  ml       ----------  LA volume/bsa, ES, 1-p A2C               27.3  ml/m^2   ----------    Mitral  valve                             Value          Reference  Mitral E-wave peak velocity              91.2  cm/s     ----------  Mitral A-wave peak velocity              50.4  cm/s     ----------  Mitral deceleration time       (L)       102   ms       150 - 230  Mitral pressure half-time                30    ms        ----------  Mitral peak gradient, D                  3     mm Hg    ----------  Mitral E/A ratio, peak                   1.8            ----------  Mitral valve area, PHT, DP               7.33  cm^2     ----------  Mitral valve area/bsa, PHT, DP           3.8   cm^2/m^2 ----------    Right atrium                             Value          Reference  RA ID, S-I, ES, A4C                      41    mm       34 - 49  RA area, ES, A4C                         12.8  cm^2     8.3 - 19.5  RA volume, ES, A/L                       33.5  ml       ----------  RA volume/bsa, ES, A/L                   17.4  ml/m^2   ----------    Right ventricle                          Value          Reference  TAPSE                                    13    mm       ----------  RV s&', lateral, S                        12.8  cm/s     ----------  Legend: (L)  and  (H)  mark values outside specified  reference range.  ------------------------------------------------------------------- Prepared and Electronically Authenticated by  Marca Ancona, M.D. 2018-10-16T16:18:21   Recent Lab Findings: Lab Results  Component Value Date   WBC 17.6 (H) 10/23/2016   HGB 11.8 (L) 10/23/2016   HCT 33.6 (L) 10/23/2016   PLT 301 10/23/2016   GLUCOSE 101 (H) 10/23/2016   NA 131 (L) 10/23/2016   K 3.5 10/23/2016   CL 95 (L) 10/23/2016   CREATININE 0.77 10/23/2016   BUN 8 10/23/2016   CO2 26 10/23/2016   INR 1.03 10/22/2016      Assessment / Plan: Acute on chronic heart failure with hypoxia  respiratory distress  Ischemic and alcoholic cardiomyopathy  Severe three-vessel coronary disease with total occlusion of the proximal circumflex and the previously stented segment. Faint left to left collaterals are noted  posterior segment of the right upper lobe concerning for superimposed pneumonia on CT scan At high risk for alcohol withdrawal   Will need to improve before considering CABG but long term would offer best  option for recovery  Aggressive management of decompensated heart failure started  Currently Patient shares that he does not want CABG.    I  spent 30 minutes counseling the patient face to face and 50% or more the  time was spent in counseling and coordination of care. The total time spent in the appointment was 60 minutes.    Delight Ovens MD      [address] [place] [place] [address] Office [phone]   Beeper [phone]  10/23/2016 12:00 PM

## 2016-10-23 NOTE — Evaluation (Signed)
Physical Therapy Evaluation Patient Details Name: Eduardo [name] MRN: [id number] DOB: 08/06/1965 Today's Date: 10/23/2016   History of Present Illness  52 y.o. male with CAD/prior MI/prior stenting of the Circumflex in 2008, tobacco abuse admitted after surgery for left tib/fib fracture (IM nail). Pt with chest pain post op with NSTEMI and was seen by Dr. Excell Seltzer. No EKG changes so planned for outpatient stress test. He had increased SOB 10/16 around 4 am and reported pulmonary edema. Troponin elevated at 5.0. Underwent cardiac cath , 3v disease with EF of 20%.   Past Medical History:  Diagnosis Date  . Anxiety   . Dyspnea   . Myocardial infarction [facility])    July 2008    Clinical Impression  Pt admitted with above diagnosis. Pt currently with functional limitations due to the deficits listed below (see PT Problem List). Pt was able to squat pivot to 3N1 and recliner maintaining NWB left LE with min guard to min assist.  Pt should progress well.  Will follow acutely.   Pt will benefit from skilled PT to increase their independence and safety with mobility to allow discharge to the venue listed below.      Follow Up Recommendations Home health PT;Supervision/Assistance - 24 hour    Equipment Recommendations  Rolling walker with 5" wheels    Recommendations for Other Services       Precautions / Restrictions Precautions Precautions: Fall Restrictions Weight Bearing Restrictions: Yes LLE Weight Bearing: Non weight bearing      Mobility  Bed Mobility Overal bed mobility: Needs Assistance Bed Mobility: Supine to Sit     Supine to sit: Min assist     General bed mobility comments: Assisted with movement of left LE.  Transfers Overall transfer level: Needs assistance Equipment used: None Transfers: Sit to/from [employer] Sit to Stand: Min guard;Min assist   Squat pivot transfers: Min assist;Min guard     General transfer comment: Assisted pt with power  up slightly and with safety to reach to 3N1 for pivot transfer to 3N1.  Pt urinated but could not have BM as desired.  Pt transferred to recliner with same assist.  Pt wwas able to maintain NWB left LE.    Ambulation/Gait                Stairs            Wheelchair Mobility    Modified Rankin (Stroke Patients Only)       Balance Overall balance assessment: Needs assistance Sitting-balance support: No upper extremity supported;Feet supported Sitting balance-Leahy Scale: Good     Standing balance support: Single extremity supported;During functional activity Standing balance-Leahy Scale: Poor Standing balance comment: requires at least 1 UE support for balance for partial stand pivot.,                             Pertinent Vitals/Pain Pain Assessment: 0-10 Pain Score: 5  Pain Location: left LE Pain Descriptors / Indicators: Aching;Grimacing;Guarding Pain Intervention(s): Limited activity within patient's tolerance;Monitored during session;Repositioned;Ice applied  Pt on 3LO2 on arrival with sats >90%.  Removed for activity with sats 88% on RA therefore placed pt back on 2L after treatment as pt was 91-92% on RA per nurse request.   Home Living Family/patient expects to be discharged to:: Private residence Living Arrangements: Spouse/significant other Available Help at Discharge: Friend(s);Family;Available 24 hours/day (sister, girlfriend and friend per pt) Type of Home: House Home  Access: Stairs to enter Entrance Stairs-Rails: Right Entrance Stairs-Number of Steps: 2 Home Layout: One level Home Equipment: Crutches;Wheelchair - [employer]manual;Bedside commode;Tub bench      Prior Function Level of Independence: Independent with assistive device(s)               Hand Dominance        Extremity/Trunk Assessment   Upper Extremity Assessment Upper Extremity Assessment: Defer to OT evaluation    Lower Extremity Assessment Lower Extremity  Assessment: LLE deficits/detail LLE: Unable to fully assess due to pain    Cervical / Trunk Assessment Cervical / Trunk Assessment: Normal  Communication   Communication: No difficulties  Cognition Arousal/Alertness: Awake/alert Behavior During Therapy: Flat affect;Anxious Overall Cognitive Status: Within Functional Limits for tasks assessed                                        General Comments      Exercises General Exercises - Lower Extremity Ankle Circles/Pumps: AROM;10 reps;Supine;Right   Assessment/Plan    PT Assessment Patient needs continued PT services  PT Problem List Decreased strength;Decreased activity tolerance;Decreased balance;Decreased mobility;Decreased knowledge of use of DME;Decreased safety awareness;Decreased knowledge of precautions;Pain       PT Treatment Interventions DME instruction;Gait training;Functional mobility training;Therapeutic activities;Therapeutic exercise;Balance training;Patient/family education    PT Goals (Current goals can be found in the Care Plan section)  Acute Rehab PT Goals Patient Stated Goal: to go home PT Goal Formulation: With patient Time For Goal Achievement: 11/06/16 Potential to Achieve Goals: Good    Frequency Min 3X/week   Barriers to discharge        Co-evaluation               AM-PAC PT "6 Clicks" Daily Activity  Outcome Measure Difficulty turning over in bed (including adjusting bedclothes, sheets and blankets)?: A Little Difficulty moving from lying on back to sitting on the side of the bed? : A Little Difficulty sitting down on and standing up from a chair with arms (e.g., wheelchair, bedside commode, etc,.)?: A Little Help needed moving to and from a bed to chair (including a wheelchair)?: A Little Help needed walking in hospital room?: Total Help needed climbing 3-5 steps with a railing? : Total 6 Click Score: 14    End of Session Equipment Utilized During Treatment: Gait  belt;Oxygen Activity Tolerance: Patient limited by fatigue;Patient limited by pain Patient left: in chair;with call bell/phone within reach Nurse Communication: Mobility status PT Visit Diagnosis: Muscle weakness (generalized) (M62.81);Pain;Unsteadiness on feet (R26.81) Pain - Right/Left: Left Pain - part of body: Leg    Time: [id number] PT Time Calculation (min) (ACUTE ONLY): 25 min   Charges:   PT Evaluation $PT Eval Moderate Complexity: 1 Mod PT Treatments $Therapeutic Activity: 8-22 mins   PT G Codes:        Carrell Rahmani,PT Acute Rehabilitation [phone])   Berline Lopesawn F Amaurie Schreckengost 10/23/2016, 11:34 AM

## 2016-10-23 NOTE — Progress Notes (Signed)
PROGRESS NOTE    Eduardo [name]  HQI:[id number] DOB: 1965/08/15 DOA: 10/21/2016 PCP: Patient, No Pcp Per   Brief Narrative:   51 y.o. male PMHx Anxiety, MI July 2008,Tobacco abuse  Presents for evaluation of Left leg pain after injury where he suffered a left tib-fib fracture.. It has been present for Less than a week and has been worsening. He has failed conservative measures. Pain is rated as severe.   POD from ORIF of L tib/fib fx when he had CP and Cardiology was consulted.  At the time of Dr. Earmon Phoenix evaluation his pain had resolved.  That night/early next morning at about 4am he was awoken from sleep by sudden onset severe SOB, and a cough productive of pink frothy sputum.  His O2 sat dropped, and his was in 150s sinus.  He denied CP, jaw pain, left arm pain.   EKG noted ST changes in 2,3,AVF.  Cards fellow didn't feel it was a STEMI.  CXR showed pulmonary vascular congestion and pulmonary edema.  His respiratory status was stabilized with BIPAP.    Subjective: 10/17 A/O 4, positive acute SOB, negative CP, negative abdominal pain, negative N/V. States DOES NOT want CABG. Cardiothoracic surgery has not spoken with patient yet.last 24 hours MAXIMUM TEMPERATURE 38.1C    Assessment & Plan:   Principal Problem:   Displaced comminuted fracture of shaft of left tibia, initial encounter for closed fracture Active Problems:   Left tibial fracture   Acute respiratory failure with hypoxia (HCC)   Flash pulmonary edema (HCC)   Non-ST elevation (NSTEMI) myocardial infarction [facility])   Coronary artery disease involving native coronary artery of native heart without angina pectoris   Displaced LEFT TIB FIB fx - S/P ORIF -Care as per Ortho  Chronic Systolic and Diastolic CHF -unknown base weight -Strict in and out since admission -1.3 L -Daily weight Filed Weights   10/22/16 0418 10/22/16 1829 10/23/16 0801  Weight: 170 lb 6.4 oz (77.3 kg) 167 lb 1.7 oz (75.8 kg) 170 lb 6.7 oz  (77.3 kg)   Severe three-vessel disease -Cardiology recommends CABG. Patient states DOES NOT want CABG. -Consult from Cardiothoracic Surgery pending   Acute Respiratory Failure with Hypoxia/Acute Pulmonary Edema  -CT angiogram PE protocol negative for PE, however did show possible superimposed pneumonia See results below   Elevated troponin/CAD -see severe three-vessel disease     DVT prophylaxis: Heparin drip Code Status: full Family Communication: none Disposition Plan: per cardiology/cardiothoracic surgery   Consultants:  Cardiology    Procedures/Significant Events:  10/15 INTRAMEDULLARY (IM) ROD TIBIA/FIBULA FRACTURE (Left) 10/16 Echocardiogram:LVEF= 20%.Diffuse hypokinesis. -Grade 2 Diastolic dysfunction). 10/16 LEFT Heart Cardiac Catheterization:-Acute on chronic systolic heart failure with LVEDP 35 mmHg.  -Leftventriculography was not performed but echo done earlier today demonstrated EF less than 20%. -Severe three-vessel coronary disease with total occlusion of the proximal circumflex and the previously stented segment. Faint left to left collaterals are noted. -Severe diffuse disease in the proximal and mid LAD. -Severe mid RCA stenosis. 10/16 CT angiogram PE protocol:negative PE.-pulmonary edema.-Consolidative airspace disease posterior segment RUL concerning for superimposed pneumonia   I have personally reviewed and interpreted all radiology studies and my findings are as above.  VENTILATOR SETTINGS: none   Cultures 10/16 MRSA by PCR negative  Antimicrobials: Anti-infectives    Start     Stop   10/21/16 2000  ceFAZolin (ANCEF) IVPB 2g/100 mL premix     10/22/16 1430   10/21/16 0932  clindamycin (CLEOCIN) 900 MG/50ML IVPB  Comments:  Eduardo [name], Catherine   : cabinet override   10/21/16 1140   10/21/16 0924  clindamycin (CLEOCIN) IVPB 900 mg     10/21/16 1210       Devices   LINES / TUBES:      Continuous Infusions: . sodium chloride    .  heparin 1,400 Units/hr (10/23/16 0009)  . methocarbamol (ROBAXIN)  IV       Objective: Vitals:   10/23/16 0300 10/23/16 0400 10/23/16 0500 10/23/16 0600  BP: 133/67 137/67 (!) 149/72 133/74  Pulse: (!) 107 (!) 105 (!) 104 (!) 105  Resp: (!) 29 (!) 27 (!) 25 (!) 24  Temp:  99.4 F (37.4 C)    TempSrc:  Oral    SpO2: 94% 93% 94% 95%  Weight:      Height:        Intake/Output Summary (Last 24 hours) at 10/23/16 0752 Last data filed at 10/23/16 0600  Gross per 24 hour  Intake            774.4 ml  Output             2705 ml  Net          -1930.6 ml   Filed Weights   10/21/16 1759 10/22/16 0418 10/22/16 1829  Weight: 165 lb 8 oz (75.1 kg) 170 lb 6.4 oz (77.3 kg) 167 lb 1.7 oz (75.8 kg)    Examination:  General: A/O 4, positive acute respiratory distress Neck:  Negative scars, masses, torticollis, lymphadenopathy, JVD Lungs: Clear to auscultation bilaterally without wheezes or crackles Cardiovascular: Regular rate and rhythm without murmur gallop or rub normal S1 and S2 Abdomen: negative abdominal pain, nondistended, positive soft, bowel sounds, no rebound, no ascites, no appreciable mass Extremities: No significant cyanosis, clubbing, L LE in a splint wrapped with Ace bandage did not take down. Skin: Negative rashes, lesions, ulcers Psychiatric:  Negative depression, negative anxiety, negative fatigue, negative mania  Central nervous system:  Cranial nerves II through XII intact, tongue/uvula midline, all extremities muscle strength 5/5, sensation intact throughout,  negative dysarthria, negative expressive aphasia, negative receptive aphasia.  .     Data Reviewed: Care during the described time interval was provided by me .  I have reviewed this patient's available data, including medical history, events of note, physical examination, and all test results as part of my evaluation.   CBC:  Recent Labs Lab 10/21/16 0926 10/22/16 0416 10/23/16 0552  WBC 13.3* 22.3*  17.6*  HGB 12.5* 13.2 11.8*  HCT 37.2* 38.6* 33.6*  MCV 100.0 101.0* 98.2  PLT 321 361 301   Basic Metabolic Panel:  Recent Labs Lab 10/21/16 0926 10/22/16 0416  NA 140 133*  K 4.1 4.3  CL 104 97*  CO2 26 26  GLUCOSE 104* 193*  BUN 10 10  CREATININE 0.84 0.85  CALCIUM 9.1 8.7*   GFR: Estimated Creatinine Clearance: 94 mL/min (by C-G formula based on SCr of 0.85 mg/dL). Liver Function Tests: No results for input(s): AST, ALT, ALKPHOS, BILITOT, PROT, ALBUMIN in the last 168 hours. No results for input(s): LIPASE, AMYLASE in the last 168 hours. No results for input(s): AMMONIA in the last 168 hours. Coagulation Profile:  Recent Labs Lab 10/22/16 1118  INR 1.03   Cardiac Enzymes:  Recent Labs Lab 10/22/16 0416  TROPONINI 5.00*   BNP (last 3 results) No results for input(s): PROBNP in the last 8760 hours. HbA1C: No results for input(s): HGBA1C in the last 72 hours.  CBG: No results for input(s): GLUCAP in the last 168 hours. Lipid Profile: No results for input(s): CHOL, HDL, LDLCALC, TRIG, CHOLHDL, LDLDIRECT in the last 72 hours. Thyroid Function Tests: No results for input(s): TSH, T4TOTAL, FREET4, T3FREE, THYROIDAB in the last 72 hours. Anemia Panel: No results for input(s): VITAMINB12, FOLATE, FERRITIN, TIBC, IRON, RETICCTPCT in the last 72 hours. Urine analysis: No results found for: COLORURINE, APPEARANCEUR, LABSPEC, PHURINE, GLUCOSEU, HGBUR, BILIRUBINUR, KETONESUR, PROTEINUR, UROBILINOGEN, NITRITE, LEUKOCYTESUR Sepsis Labs: @LABRCNTIP (procalcitonin:4,lacticidven:4)  ) Recent Results (from the past 240 hour(s))  Surgical pcr screen     Status: None   Collection Time: 10/21/16 10:06 AM  Result Value Ref Range Status   MRSA, PCR NEGATIVE NEGATIVE Final   Staphylococcus aureus NEGATIVE NEGATIVE Final    Comment: (NOTE) The Xpert SA Assay (FDA approved for NASAL specimens in patients 2 years of age and older), is one component of a  comprehensive surveillance program. It is not intended to diagnose infection nor to guide or monitor treatment.   MRSA PCR Screening     Status: None   Collection Time: 10/22/16  4:23 AM  Result Value Ref Range Status   MRSA by PCR NEGATIVE NEGATIVE Final    Comment:        The GeneXpert MRSA Assay (FDA approved for NASAL specimens only), is one component of a comprehensive MRSA colonization surveillance program. It is not intended to diagnose MRSA infection nor to guide or monitor treatment for MRSA infections.          Radiology Studies: Dg Tibia/fibula Left  Result Date: 10/21/2016 CLINICAL DATA:  LEFT tibial fracture, surgery EXAM: LEFT TIBIA AND FIBULA - 2 VIEW; DG C-ARM 61-120 MIN COMPARISON:  10/12/2016 FLUOROSCOPY TIME:  2 minutes 29 seconds Images obtained: 4 FINDINGS: Diffuse osseous demineralization. IM nail with proximal and distal locking screws has been placed across an oblique distal LEFT tibial diaphyseal fracture. Knee and ankle joint alignments normal. Previously identified oblique proximal and distal LEFT fibular diaphyseal fractures are less well visualized on submitted images. IMPRESSION: Post nailing of a reduced distal LEFT tibial diaphyseal fracture. Oblique fractures of the proximal and distal LEFT fibular diaphysis. Electronically Signed   By: Ulyses Southward M.D.   On: 10/21/2016 15:31   Ct Angio Chest Pe W Or Wo Contrast  Result Date: 10/22/2016 CLINICAL DATA:  Shortness of breath. Status post ORIF left lower leg. EXAM: CT ANGIOGRAPHY CHEST WITH CONTRAST TECHNIQUE: Multidetector CT imaging of the chest was performed using the standard protocol during bolus administration of intravenous contrast. Multiplanar CT image reconstructions and MIPs were obtained to evaluate the vascular anatomy. CONTRAST:  100 mL Isovue 370 COMPARISON:  None. FINDINGS: Cardiovascular: Satisfactory opacification of the pulmonary arteries to the segmental level. No evidence of  pulmonary embolism. Normal heart size. No pericardial effusion. Thoracic aorta is normal in caliber. Mediastinum/Nodes: No enlarged mediastinal, hilar, or axillary lymph nodes. Thyroid gland, trachea, and esophagus demonstrate no significant findings. Lungs/Pleura: Small bilateral pleural effusions. Bibasilar atelectasis. Bilateral perihilar patchy airspace disease. More consolidated airspace disease in the posterior segment of the right upper lobe. Upper Abdomen: No acute upper abdominal abnormality. Musculoskeletal: No acute osseous abnormality. No lytic or sclerotic osseous lesion. Review of the MIP images confirms the above findings. IMPRESSION: 1. No evidence of pulmonary embolus. 2. Pulmonary edema. 3. More consolidated airspace disease in the posterior segment of the right upper lobe concerning for superimposed pneumonia. Electronically Signed   By: Elige Ko   On: 10/22/2016 11:08  Dg Chest Port 1 View  Result Date: 10/22/2016 CLINICAL DATA:  Oxygen desaturation. EXAM: PORTABLE CHEST 1 VIEW COMPARISON:  None. FINDINGS: Cardiac enlargement with pulmonary vascular congestion. Bilateral interstitial and perihilar airspace disease, more prominent on the right. This likely represents diffuse pulmonary edema. Multifocal pneumonia could also have this appearance. No blunting of costophrenic angles. No pneumothorax. Mediastinal contours appear intact. Calcification of the aorta. Degenerative changes in the spine and shoulders. IMPRESSION: Cardiac enlargement with pulmonary vascular congestion and bilateral airspace and interstitial infiltrates, likely due to edema. Electronically Signed   By: Burman Nieves M.D.   On: 10/22/2016 04:15   Dg C-arm 1-60 Min  Result Date: 10/21/2016 CLINICAL DATA:  LEFT tibial fracture, surgery EXAM: LEFT TIBIA AND FIBULA - 2 VIEW; DG C-ARM 61-120 MIN COMPARISON:  10/12/2016 FLUOROSCOPY TIME:  2 minutes 29 seconds Images obtained: 4 FINDINGS: Diffuse osseous  demineralization. IM nail with proximal and distal locking screws has been placed across an oblique distal LEFT tibial diaphyseal fracture. Knee and ankle joint alignments normal. Previously identified oblique proximal and distal LEFT fibular diaphyseal fractures are less well visualized on submitted images. IMPRESSION: Post nailing of a reduced distal LEFT tibial diaphyseal fracture. Oblique fractures of the proximal and distal LEFT fibular diaphysis. Electronically Signed   By: Ulyses Southward M.D.   On: 10/21/2016 15:31        Scheduled Meds: . aspirin EC  81 mg Oral Daily  . docusate sodium  100 mg Oral BID  . mouth rinse  15 mL Mouth Rinse BID  . metoprolol succinate  12.5 mg Oral Daily  . simvastatin  40 mg Oral q1800  . sodium chloride flush  3 mL Intravenous Q12H  . terbinafine   Topical Daily   Continuous Infusions: . sodium chloride    . heparin 1,400 Units/hr (10/23/16 0009)  . methocarbamol (ROBAXIN)  IV       LOS: 2 days    Time spent: 40 minutes    WOODS, Roselind Messier, MD [facility] Pager [phone]   If 7PM-7AM, please contact night-coverage [email or web address] Password [email or web address] 10/23/2016, 7:52 AM

## 2016-10-23 NOTE — Progress Notes (Signed)
ANTICOAGULATION CONSULT NOTE - Follow Up Consult  Pharmacy Consult for Heparin Indication: R/O ACS  Patient Measurements: Height: 5\' 7"  (170.2 cm) Weight: 170 lb 6.7 oz (77.3 kg) IBW/kg (Calculated) : 66.1  Assessment: 51 y.o. male with chest pain/SOB for heparin.  CT neg for PE, LHC shows 3v CAD and EF 20% and volume overloaded with plans to diurese and then determine re-vascularization plan.   Heparin level therapeutic this eveings  Goal of Therapy:  Heparin level 0.3-0.7 units/ml Monitor platelets by anticoagulation protocol: Yes   Plan:  Continue heparin at 1500 units / hr Follow up AM labs  Thank you Okey RegalLisa Teaira Croft, PharmD [phone] 10/23/2016 4:03 PM

## 2016-10-23 NOTE — Anesthesia Postprocedure Evaluation (Signed)
Anesthesia Post Note  Patient: Eduardo [name]  Procedure(s) Performed: INTRAMEDULLARY (IM) ROD TIBIA/FIBULA FRACTURE (Left Leg Lower)     Patient location during evaluation: PACU Anesthesia Type: Spinal Level of consciousness: oriented and awake and alert Pain management: pain level controlled Vital Signs Assessment: post-procedure vital signs reviewed and stable Respiratory status: spontaneous breathing, respiratory function stable and patient connected to nasal cannula oxygen Cardiovascular status: blood pressure returned to baseline and stable Postop Assessment: no headache, no backache and no apparent nausea or vomiting Anesthetic complications: no    Last Vitals:  Vitals:   10/23/16 0800 10/23/16 0810  BP: (!) 152/77   Pulse: (!) 104   Resp: 18   Temp:  (!) 38.1 C  SpO2: 98%     Last Pain:  Vitals:   10/23/16 0810  TempSrc: Oral  PainSc:                  Adrion Menz,JAMES TERRILL

## 2016-10-23 NOTE — Care Management Note (Signed)
Case Management Note Donn PieriniKristi Pheonix Wisby RN, BSN Unit 4E-Case Manager-- 2H coverage [phone]  Patient Details  Name: Eduardo [name] A Trethewey MRN: [id number] Date of Birth: 06/25/1965  Subjective/Objective:   Pt admitted with leg pain- Left Tib/Fib fracture s/p IM rod-10/15  Post op with Elevated troponin in setting of flash pulmonary edema in pt with known CAD- NSTEMI- s/p cath on 10/16- pt with severe 3VD- TCTS consulted.                  Action/Plan: PTA pt lived at home, - T eval pending- CM to follow for d/c needs.   Expected Discharge Date:                  Expected Discharge Plan:  Skilled Nursing Facility  In-House Referral:  Clinical Social Work  Discharge planning Services  CM Consult  Post Acute Care Choice:    Choice offered to:     DME Arranged:    DME Agency:     HH Arranged:    HH Agency:     Status of Service:  In process, will continue to follow  If discussed at Long Length of Stay Meetings, dates discussed:    Discharge Disposition:   Additional Comments:  Darrold SpanWebster, Shoua Ressler Hall, RN 10/23/2016, 10:17 AM

## 2016-10-24 ENCOUNTER — Inpatient Hospital Stay (HOSPITAL_COMMUNITY): Payer: BLUE CROSS/BLUE SHIELD

## 2016-10-24 DIAGNOSIS — J810 Acute pulmonary edema: Secondary | ICD-10-CM

## 2016-10-24 DIAGNOSIS — I5021 Acute systolic (congestive) heart failure: Secondary | ICD-10-CM

## 2016-10-24 DIAGNOSIS — I2510 Atherosclerotic heart disease of native coronary artery without angina pectoris: Secondary | ICD-10-CM

## 2016-10-24 DIAGNOSIS — I5042 Chronic combined systolic (congestive) and diastolic (congestive) heart failure: Secondary | ICD-10-CM

## 2016-10-24 DIAGNOSIS — R7989 Other specified abnormal findings of blood chemistry: Secondary | ICD-10-CM

## 2016-10-24 DIAGNOSIS — R778 Other specified abnormalities of plasma proteins: Secondary | ICD-10-CM

## 2016-10-24 LAB — CBC
HEMATOCRIT: 33.7 % — AB (ref 39.0–52.0)
HEMOGLOBIN: 11.5 g/dL — AB (ref 13.0–17.0)
MCH: 33.8 pg (ref 26.0–34.0)
MCHC: 34.1 g/dL (ref 30.0–36.0)
MCV: 99.1 fL (ref 78.0–100.0)
Platelets: 310 10*3/uL (ref 150–400)
RBC: 3.4 MIL/uL — AB (ref 4.22–5.81)
RDW: 11.7 % (ref 11.5–15.5)
WBC: 15 10*3/uL — ABNORMAL HIGH (ref 4.0–10.5)

## 2016-10-24 LAB — BASIC METABOLIC PANEL
ANION GAP: 8 (ref 5–15)
BUN: 10 mg/dL (ref 6–20)
CHLORIDE: 99 mmol/L — AB (ref 101–111)
CO2: 28 mmol/L (ref 22–32)
Calcium: 8.4 mg/dL — ABNORMAL LOW (ref 8.9–10.3)
Creatinine, Ser: 0.79 mg/dL (ref 0.61–1.24)
Glucose, Bld: 100 mg/dL — ABNORMAL HIGH (ref 65–99)
POTASSIUM: 3.8 mmol/L (ref 3.5–5.1)
SODIUM: 135 mmol/L (ref 135–145)

## 2016-10-24 LAB — PULMONARY FUNCTION TEST
FEF 25-75 Pre: 1.69 L/sec
FEF2575-%Pred-Pre: 55 %
FEV1-%Pred-Pre: 50 %
FEV1-Pre: 1.74 L
FEV1FVC-%Pred-Pre: 104 %
FEV6-%Pred-Pre: 50 %
FEV6-Pre: 2.18 L
FEV6FVC-%Pred-Pre: 104 %
FVC-%Pred-Pre: 48 %
FVC-Pre: 2.18 L
Pre FEV1/FVC ratio: 80 %
Pre FEV6/FVC Ratio: 100 %

## 2016-10-24 LAB — HEPARIN LEVEL (UNFRACTIONATED): HEPARIN UNFRACTIONATED: 0.18 [IU]/mL — AB (ref 0.30–0.70)

## 2016-10-24 LAB — MAGNESIUM: MAGNESIUM: 1.8 mg/dL (ref 1.7–2.4)

## 2016-10-24 MED ORDER — ENOXAPARIN SODIUM 40 MG/0.4ML ~~LOC~~ SOLN
40.0000 mg | SUBCUTANEOUS | Status: DC
  Administered 2016-10-24 – 2016-10-27 (×4): 40 mg via SUBCUTANEOUS
  Filled 2016-10-24 (×4): qty 0.4

## 2016-10-24 MED ORDER — SPIRONOLACTONE 25 MG PO TABS
12.5000 mg | ORAL_TABLET | Freq: Every day | ORAL | Status: DC
  Administered 2016-10-24 – 2016-10-27 (×4): 12.5 mg via ORAL
  Filled 2016-10-24 (×4): qty 1

## 2016-10-24 MED ORDER — ATORVASTATIN CALCIUM 80 MG PO TABS
80.0000 mg | ORAL_TABLET | Freq: Every day | ORAL | Status: DC
  Administered 2016-10-24 – 2016-10-26 (×3): 80 mg via ORAL
  Filled 2016-10-24 (×3): qty 1

## 2016-10-24 MED ORDER — MAGNESIUM SULFATE IN D5W 1-5 GM/100ML-% IV SOLN
1.0000 g | Freq: Once | INTRAVENOUS | Status: AC
  Administered 2016-10-24: 1 g via INTRAVENOUS
  Filled 2016-10-24: qty 100

## 2016-10-24 MED ORDER — POTASSIUM CHLORIDE CRYS ER 20 MEQ PO TBCR
40.0000 meq | EXTENDED_RELEASE_TABLET | Freq: Once | ORAL | Status: AC
  Administered 2016-10-24: 40 meq via ORAL
  Filled 2016-10-24: qty 2

## 2016-10-24 NOTE — Progress Notes (Signed)
ANTICOAGULATION CONSULT NOTE - Follow Up Consult  Pharmacy Consult for Heparin Indication: R/O ACS  Patient Measurements: Height: 5\' 7"  (170.2 cm) Weight: 170 lb 6.7 oz (77.3 kg) IBW/kg (Calculated) : 66.1  Assessment: 51 y.o. male with chest pain/SOB for heparin. CT neg for PE, LHC shows 3v CAD.   Heparin level subtherapeutic at 0.18. CBC stable. RN reports some oozing at IV site, which has been unchanged since the beginning of shift.   Will hold bolus in setting of recent cath.   Goal of Therapy:  Heparin level 0.3-0.7 units/ml Monitor platelets by anticoagulation protocol: Yes   Plan:  Increase heparin gtt to 1700 units/hr Heparin level in 6 hrs Daily heparin level and CBC Monitor for s/s bleeding  Einar CrowKatherine Aamina Skiff, PharmD Clinical Pharmacist 10/24/16 3:16 AM

## 2016-10-24 NOTE — Progress Notes (Signed)
Came to patient's room to perform bedside PFT per MD orders.  Patient agreeable to start the test.  Upon starting the test, the patient had marginal effort.  After the second effort with the FVC maneuver, the patient refused any further testing.  Unable to complete test.  Bedside RN notified.

## 2016-10-24 NOTE — Progress Notes (Signed)
Subjective: 3 Days Post-Op Procedure(s) (LRB): INTRAMEDULLARY (IM) ROD TIBIA/FIBULA FRACTURE (Left) Patient reports pain as mild.  Patient seems hesitant to move forward with recommended treatment from cardiac surgeons.  Objective: Vital signs in last 24 hours: Temp:  [98.4 F (36.9 C)-99.1 F (37.3 C)] 98.7 F (37.1 C) (10/18 1251) Pulse Rate:  [68-122] 96 (10/18 1000) Resp:  [15-33] 15 (10/18 0900) BP: (97-113)/(44-88) 112/77 (10/18 1000) SpO2:  [79 %-100 %] 96 % (10/18 1000) Weight:  [74.4 kg (164 lb 0.4 oz)] 74.4 kg (164 lb 0.4 oz) (10/18 0500)  Intake/Output from previous day: 10/17 0701 - 10/18 0700 In: 605.8 [P.O.:240; I.V.:365.8] Out: 2000 [Urine:2000] Intake/Output this shift: Total I/O In: 257 [P.O.:240; I.V.:17] Out: 375 [Urine:375]   Recent Labs  10/22/16 0416 10/23/16 0552 10/24/16 0220  HGB 13.2 11.8* 11.5*    Recent Labs  10/23/16 0552 10/24/16 0220  WBC 17.6* 15.0*  RBC 3.42* 3.40*  HCT 33.6* 33.7*  PLT 301 310    Recent Labs  10/23/16 0552 10/24/16 0220  NA 131* 135  K 3.5 3.8  CL 95* 99*  CO2 26 28  BUN 8 10  CREATININE 0.77 0.79  GLUCOSE 101* 100*  CALCIUM 8.5* 8.4*    Recent Labs  10/22/16 1118  INR 1.03   Left lower extremity exam: Posterior splint intact. Moves toes actively. No swelling in toes. Ace wrap around splint is clean and dry.  Assessment/Plan: 3 Days Post-Op Procedure(s) (LRB): INTRAMEDULLARY (IM) ROD TIBIA/FIBULA FRACTURE (Left)  Significant coronary artery disease. Plan: We greatly appreciate the medical care/cardiology care that has been given. Once medically well enough to ambulate can ambulate nonweightbearing on the left lower extremity. It is okay if he rests his leg on the floor for balance. We will follow the patient along. He will need to have his posterior splint and dressing changed in 10 days as an outpatient at Dr. Stacy GardnerGraves's office.  Tearra Ouk G 10/24/2016, 1:41 PM

## 2016-10-24 NOTE — Progress Notes (Signed)
Progress Note  Patient Name: Eduardo [name] Date of Encounter: 10/24/2016  Primary Cardiologist: New  Subjective   Feeling better. No chest pain or dyspnea at rest.   Inpatient Medications    Scheduled Meds: . aspirin EC  81 mg Oral Daily  . docusate sodium  100 mg Oral BID  . furosemide  20 mg Intravenous Q12H  . lisinopril  2.5 mg Oral Daily  . mouth rinse  15 mL Mouth Rinse BID  . metoprolol succinate  25 mg Oral Daily  . potassium chloride  20 mEq Oral BID  . simvastatin  40 mg Oral q1800  . sodium chloride flush  3 mL Intravenous Q12H  . terbinafine   Topical Daily   Continuous Infusions: . sodium chloride    . heparin 1,700 Units/hr (10/24/16 0700)  . methocarbamol (ROBAXIN)  IV     PRN Meds: sodium chloride, [DISCONTINUED] acetaminophen **OR** acetaminophen, acetaminophen, bisacodyl, HYDROmorphone (DILAUDID) injection, methocarbamol **OR** methocarbamol (ROBAXIN)  IV, ondansetron **OR** ondansetron (ZOFRAN) IV, oxyCODONE, polyethylene glycol, sodium chloride flush, zolpidem   Vital Signs    Vitals:   10/24/16 0400 10/24/16 0500 10/24/16 0600 10/24/16 0700  BP: 100/70 106/76 111/77   Pulse: 93 97 96 95  Resp: (!) 25 (!) 27 (!) 26 (!) 26  Temp: 98.7 F (37.1 C)     TempSrc: Oral     SpO2: 97% 96% 96% 97%  Weight:  164 lb 0.4 oz (74.4 kg)    Height:        Intake/Output Summary (Last 24 hours) at 10/24/16 0754 Last data filed at 10/24/16 0700  Gross per 24 hour  Intake            605.8 ml  Output             2000 ml  Net          -1394.2 ml   Filed Weights   10/22/16 1829 10/23/16 0801 10/24/16 0500  Weight: 167 lb 1.7 oz (75.8 kg) 170 lb 6.7 oz (77.3 kg) 164 lb 0.4 oz (74.4 kg)    Telemetry    NSR, no sustained arrhythmia - Personally Reviewed   Physical Exam  Alert, oriented male GEN: No acute distress.   Neck: No JVD Cardiac: RRR, no murmurs, rubs, or gallops.  Respiratory: Clear to auscultation bilaterally. GI: Soft, nontender,  non-distended  MS: No edema; No deformity. Left leg dressed Neuro:  Nonfocal  Psych: Normal affect   Labs    Chemistry Recent Labs Lab 10/22/16 0416 10/23/16 0552 10/24/16 0220  NA 133* 131* 135  K 4.3 3.5 3.8  CL 97* 95* 99*  CO2 26 26 28   GLUCOSE 193* 101* 100*  BUN 10 8 10   CREATININE 0.85 0.77 0.79  CALCIUM 8.7* 8.5* 8.4*  GFRNONAA >60 >60 >60  GFRAA >60 >60 >60  ANIONGAP 10 10 8      Hematology Recent Labs Lab 10/22/16 0416 10/23/16 0552 10/24/16 0220  WBC 22.3* 17.6* 15.0*  RBC 3.82* 3.42* 3.40*  HGB 13.2 11.8* 11.5*  HCT 38.6* 33.6* 33.7*  MCV 101.0* 98.2 99.1  MCH 34.6* 34.5* 33.8  MCHC 34.2 35.1 34.1  RDW 11.9 11.7 11.7  PLT 361 301 310    Cardiac Enzymes Recent Labs Lab 10/22/16 0416  TROPONINI 5.00*   No results for input(s): TROPIPOC in the last 168 hours.   BNP Recent Labs Lab 10/22/16 0417  BNP 233.5*     DDimer  Recent Labs Lab 10/22/16 [phone]  DDIMER 11.13*     Radiology    Ct Angio Chest Pe W Or Wo Contrast  Result Date: 10/22/2016 CLINICAL DATA:  Shortness of breath. Status post ORIF left lower leg. EXAM: CT ANGIOGRAPHY CHEST WITH CONTRAST TECHNIQUE: Multidetector CT imaging of the chest was performed using the standard protocol during bolus administration of intravenous contrast. Multiplanar CT image reconstructions and MIPs were obtained to evaluate the vascular anatomy. CONTRAST:  100 mL Isovue 370 COMPARISON:  None. FINDINGS: Cardiovascular: Satisfactory opacification of the pulmonary arteries to the segmental level. No evidence of pulmonary embolism. Normal heart size. No pericardial effusion. Thoracic aorta is normal in caliber. Mediastinum/Nodes: No enlarged mediastinal, hilar, or axillary lymph nodes. Thyroid gland, trachea, and esophagus demonstrate no significant findings. Lungs/Pleura: Small bilateral pleural effusions. Bibasilar atelectasis. Bilateral perihilar patchy airspace disease. More consolidated airspace disease  in the posterior segment of the right upper lobe. Upper Abdomen: No acute upper abdominal abnormality. Musculoskeletal: No acute osseous abnormality. No lytic or sclerotic osseous lesion. Review of the MIP images confirms the above findings. IMPRESSION: 1. No evidence of pulmonary embolus. 2. Pulmonary edema. 3. More consolidated airspace disease in the posterior segment of the right upper lobe concerning for superimposed pneumonia. Electronically Signed   By: Elige Ko   On: 10/22/2016 11:08    Cardiac Studies   Echo 10-22-2016: Left ventricle:  The cavity size was mildly dilated. Wall thickness was normal. The estimated ejection fraction was 20%. Diffuse hypokinesis. Features are consistent with a pseudonormal left ventricular filling pattern, with concomitant abnormal relaxation and increased filling pressure (grade 2 diastolic dysfunction).  ------------------------------------------------------------------- Aortic valve:   Trileaflet.  Doppler:   There was no stenosis. There was no regurgitation.  ------------------------------------------------------------------- Aorta:  Aortic root: The aortic root was normal in size. Ascending aorta: The ascending aorta was normal in size.  ------------------------------------------------------------------- Mitral valve:   Normal thickness leaflets .  Doppler:   There was no evidence for stenosis.   There was mild regurgitation.    Valve area by pressure half-time: 7.33 cm^2. Indexed valve area by pressure half-time: 3.8 cm^2/m^2.    Peak gradient (D): 3 mm Hg.   ------------------------------------------------------------------- Left atrium:  The atrium was normal in size.  ------------------------------------------------------------------- Right ventricle:  The cavity size was normal. Systolic function was mildly reduced.  ------------------------------------------------------------------- Pulmonic valve:    Structurally normal  valve.   Cusp separation was normal.  Doppler:  Transvalvular velocity was within the normal range. There was no regurgitation.  ------------------------------------------------------------------- Tricuspid valve:   Doppler:  There was no significant regurgitation.  ------------------------------------------------------------------- Pulmonary artery:   No complete TR doppler jet so unable to estimate PA systolic pressure.  ------------------------------------------------------------------- Right atrium:  The atrium was normal in size.  ------------------------------------------------------------------- Pericardium:  There was no pericardial effusion.  ------------------------------------------------------------------- Systemic veins: Inferior vena cava: The vessel was normal in size. The respirophasic diameter changes were in the normal range (>= 50%), consistent with normal central venous pressure.  Cardiac Cath 10-22-2016: Conclusion    Acute on chronic systolic heart failure with LVEDP 35 mmHg. Leftventriculography was not performed but echo done earlier today demonstrated EF less than 20%.  Severe three-vessel coronary disease with total occlusion of the proximal circumflex and the previously stented segment. Faint left to left collaterals are noted.  Severe diffuse disease in the proximal and mid LAD.  Severe mid RCA stenosis.  RECOMMENDATIONS:   Aggressive management of decompensated heart failure. Limit fluid administration.  IV Lasix 40 mg given in the cath  lab.  Guideline directed therapy for systolic heart failure.  After heart failure is better controlled, consider revascularization options.   Discussed cessation of alcohol intake with the patient.     Patient Profile     51 y.o. male with CAD/prior MI/prior stenting of the Circumflex in 2008, tobacco abuse admitted after surgery for left tib/fib fracture. Pt with chest pain post op and was seen  by Dr. Excell Seltzerooper. No EKG changes so planned for outpatient stress test. He had increased SOB 10/16 around 4 am and reported pulmonary edema. Troponin elevated at 5.0. Underwent cardiac cath showing 3v disease with EF of 20%.   Assessment & Plan    1. NSTEMI: no recurrent ischemic symptoms. Appreciate TCTS consult. Stop heparin today - change to DVT-proph dosing. Continue ASA for antiplatelet Rx. No thienopyridine as CABG is planned for revascularization after he clinically improves.   2. 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 Acute pulmonary edema likely ischemia-mediated. Continue metoprolol succinate, low-dose lisinopril, IV lasix. Limited resources - doubt Sherryll Burgerntresto an option. Add low-dose spironolactone 12.5 mg daily. JVP normal but CXR shows diffuse edema. Wean O2 as tolerated.   Clinically improved today. Less tachycardia.   3. HTN: BP now on the soft side. Continue current Rx.   4. Hyperlipidemia: change to high-intensity statin  5. Left tib/fib fx: ortho following.  Dispo: diurese, wean O2, add spiro today, discussed need for complete alcohol cessation. Will need to consider LifeVest at discharge if he is agreeable. Have not discussed with him yet. Ultimately will benefit from CABG.   For questions or updates, please contact [facility] Please consult [email or web address] for contact info under Cardiology/STEMI.     Enzo BiSigned, Vana Arif, MD  10/24/2016, 7:54 AM

## 2016-10-24 NOTE — Progress Notes (Signed)
PROGRESS NOTE    Eduardo [name]  ZOX:[id number] DOB: 1965-02-26 DOA: 10/21/2016 PCP: Patient, No Pcp Per   Brief Narrative:   51 y.o. WM PMHx Anxiety, MI July 2008,Tobacco abuse  Presents for evaluation of Left leg pain after injury where he suffered a left tib-fib fracture.. It has been present for Less than a week and has been worsening. He has failed conservative measures. Pain is rated as severe.   POD from ORIF of L tib/fib fx when he had CP and Cardiology was consulted.  At the time of Dr. Earmon Phoenix evaluation his pain had resolved.  That night/early next morning at about 4am he was awoken from sleep by sudden onset severe SOB, and a cough productive of pink frothy sputum.  His O2 sat dropped, and his was in 150s sinus.  He denied CP, jaw pain, left arm pain.   EKG noted ST changes in 2,3,AVF.  Cards fellow didn't feel it was a STEMI.  CXR showed pulmonary vascular congestion and pulmonary edema.  His respiratory status was stabilized with BIPAP.    Subjective: 10/18 a/O 4, negative SOB, negative CP, negative abdominal pain, negative N/V. States still does not want CABG after speaking with Cardiothoracic surgery.       Assessment & Plan:   Principal Problem:   Displaced comminuted fracture of shaft of left tibia, initial encounter for closed fracture Active Problems:   Left tibial fracture   Acute respiratory failure with hypoxia (HCC)   Flash pulmonary edema (HCC)   Non-ST elevation (NSTEMI) myocardial infarction [facility])   Coronary artery disease involving native coronary artery of native heart without angina pectoris   Displaced LEFT TIB FIB fx - S/P ORIF -Per Orthopedic: May start ambulating nonweightbearing on LLE. -patient to follow-up at Dr. Luiz Blare office 10 days for dressing change   Chronic Systolic and Diastolic CHF -unknown base weight -Strict in and out since admission -3.0 L -Daily weight Filed Weights   10/22/16 1829 10/23/16 0801 10/24/16 0500    Weight: 167 lb 1.7 oz (75.8 kg) 170 lb 6.7 oz (77.3 kg) 164 lb 0.4 oz (74.4 kg)   Severe three-vessel disease -Cardiology recommends CABG. Patient states DOES NOT want CABG. -Cardiothoracic Surgery recommends CABG once patient medically stable however patient states he does not want CABG.   Acute Respiratory Failure with Hypoxia/Acute Pulmonary Edema  -CT angiogram PE protocol negative for PE, however did show possible superimposed pneumonia See results below -continue to monitor off antibiotics Patient doing well no signs or symptoms of acute infection   Elevated troponin/CAD -see severe three-vessel disease   Hypokalemia -Potassium goal> 4 -K-Dur 40 meq  Hypomagnesemia Magnesium goal> 2 -magnesium IV 1 g    DVT prophylaxis: Heparin drip Code Status: full Family Communication: none Disposition Plan: per cardiology/cardiothoracic surgery   Consultants:  Cardiology Cardiothoracic surgery   Procedures/Significant Events:  10/15 INTRAMEDULLARY (IM) ROD TIBIA/FIBULA FRACTURE (Left) 10/16 Echocardiogram:LVEF= 20%.Diffuse hypokinesis. -Grade 2 Diastolic dysfunction). 10/16 LEFT Heart Cardiac Catheterization:-Acute on chronic systolic heart failure with LVEDP 35 mmHg.  -Leftventriculography was not performed but echo done earlier today demonstrated EF less than 20%. -Severe three-vessel coronary disease with total occlusion of the proximal circumflex and the previously stented segment. Faint left to left collaterals are noted. -Severe diffuse disease in the proximal and mid LAD. -Severe mid RCA stenosis. 10/16 CT angiogram PE protocol:negative PE.-pulmonary edema.-Consolidative airspace disease posterior segment RUL concerning for superimposed pneumonia   I have personally reviewed and interpreted all radiology studies and my  findings are as above.  VENTILATOR SETTINGS: none   Cultures 10/16 MRSA by PCR negative  Antimicrobials: Anti-infectives    Start     Stop    10/21/16 2000  ceFAZolin (ANCEF) IVPB 2g/100 mL premix     10/22/16 1430   10/21/16 0932  clindamycin (CLEOCIN) 900 MG/50ML IVPB    Comments:  Kathrene Bongo   : cabinet override   10/21/16 1140   10/21/16 0924  clindamycin (CLEOCIN) IVPB 900 mg     10/21/16 1210       Devices   LINES / TUBES:      Continuous Infusions: . sodium chloride    . heparin 1,700 Units/hr (10/24/16 0700)  . methocarbamol (ROBAXIN)  IV       Objective: Vitals:   10/24/16 0400 10/24/16 0500 10/24/16 0600 10/24/16 0700  BP: 100/70 106/76 111/77   Pulse: 93 97 96 95  Resp: (!) 25 (!) 27 (!) 26 (!) 26  Temp: 98.7 F (37.1 C)     TempSrc: Oral     SpO2: 97% 96% 96% 97%  Weight:  164 lb 0.4 oz (74.4 kg)    Height:        Intake/Output Summary (Last 24 hours) at 10/24/16 0735 Last data filed at 10/24/16 0700  Gross per 24 hour  Intake            605.8 ml  Output             2000 ml  Net          -1394.2 ml   Filed Weights   10/22/16 1829 10/23/16 0801 10/24/16 0500  Weight: 167 lb 1.7 oz (75.8 kg) 170 lb 6.7 oz (77.3 kg) 164 lb 0.4 oz (74.4 kg)    Physical Exam:  General: A/O 4, positive acute respiratory distress Neck:  Negative scars, masses, torticollis, lymphadenopathy, JVD Lungs: Clear to auscultation bilaterally without wheezes or crackles Cardiovascular: Regular rate and rhythm without murmur gallop or rub normal S1 and S2 Abdomen: negative abdominal pain, nondistended, positive soft, bowel sounds, no rebound, no ascites, no appreciable mass Extremities: No significant cyanosis, clubbing, L LE in a splint wrapped with Ace bandage did not take down. Skin: Negative rashes, lesions, ulcers Psychiatric:  Negative depression, negative anxiety, negative fatigue, negative mania  Central nervous system:  Cranial nerves II through XII intact, tongue/uvula midline, all extremities muscle strength 5/5, sensation intact throughout,  negative dysarthria, negative expressive aphasia,  negative receptive aphasia.     CBC:  Recent Labs Lab 10/21/16 0926 10/22/16 0416 10/23/16 0552 10/24/16 0220  WBC 13.3* 22.3* 17.6* 15.0*  HGB 12.5* 13.2 11.8* 11.5*  HCT 37.2* 38.6* 33.6* 33.7*  MCV 100.0 101.0* 98.2 99.1  PLT 321 361 301 310   Basic Metabolic Panel:  Recent Labs Lab 10/21/16 0926 10/22/16 0416 10/23/16 0552 10/24/16 0220  NA 140 133* 131* 135  K 4.1 4.3 3.5 3.8  CL 104 97* 95* 99*  CO2 26 26 26 28   GLUCOSE 104* 193* 101* 100*  BUN 10 10 8 10   CREATININE 0.84 0.85 0.77 0.79  CALCIUM 9.1 8.7* 8.5* 8.4*  MG  --   --  1.6*  --    GFR: Estimated Creatinine Clearance: 99.8 mL/min (by C-G formula based on SCr of 0.79 mg/dL). Liver Function Tests: No results for input(s): AST, ALT, ALKPHOS, BILITOT, PROT, ALBUMIN in the last 168 hours. No results for input(s): LIPASE, AMYLASE in the last 168 hours. No results for input(s): AMMONIA  in the last 168 hours. Coagulation Profile:  Recent Labs Lab 10/22/16 1118  INR 1.03   Cardiac Enzymes:  Recent Labs Lab 10/22/16 0416  TROPONINI 5.00*   BNP (last 3 results) No results for input(s): PROBNP in the last 8760 hours. HbA1C: No results for input(s): HGBA1C in the last 72 hours. CBG: No results for input(s): GLUCAP in the last 168 hours. Lipid Profile: No results for input(s): CHOL, HDL, LDLCALC, TRIG, CHOLHDL, LDLDIRECT in the last 72 hours. Thyroid Function Tests: No results for input(s): TSH, T4TOTAL, FREET4, T3FREE, THYROIDAB in the last 72 hours. Anemia Panel: No results for input(s): VITAMINB12, FOLATE, FERRITIN, TIBC, IRON, RETICCTPCT in the last 72 hours. Urine analysis: No results found for: COLORURINE, APPEARANCEUR, LABSPEC, PHURINE, GLUCOSEU, HGBUR, BILIRUBINUR, KETONESUR, PROTEINUR, UROBILINOGEN, NITRITE, LEUKOCYTESUR Sepsis Labs: @LABRCNTIP (procalcitonin:4,lacticidven:4)  ) Recent Results (from the past 240 hour(s))  Surgical pcr screen     Status: None   Collection Time:  10/21/16 10:06 AM  Result Value Ref Range Status   MRSA, PCR NEGATIVE NEGATIVE Final   Staphylococcus aureus NEGATIVE NEGATIVE Final    Comment: (NOTE) The Xpert SA Assay (FDA approved for NASAL specimens in patients 11 years of age and older), is one component of a comprehensive surveillance program. It is not intended to diagnose infection nor to guide or monitor treatment.   MRSA PCR Screening     Status: None   Collection Time: 10/22/16  4:23 AM  Result Value Ref Range Status   MRSA by PCR NEGATIVE NEGATIVE Final    Comment:        The GeneXpert MRSA Assay (FDA approved for NASAL specimens only), is one component of a comprehensive MRSA colonization surveillance program. It is not intended to diagnose MRSA infection nor to guide or monitor treatment for MRSA infections.          Radiology Studies: Ct Angio Chest Pe W Or Wo Contrast  Result Date: 10/22/2016 CLINICAL DATA:  Shortness of breath. Status post ORIF left lower leg. EXAM: CT ANGIOGRAPHY CHEST WITH CONTRAST TECHNIQUE: Multidetector CT imaging of the chest was performed using the standard protocol during bolus administration of intravenous contrast. Multiplanar CT image reconstructions and MIPs were obtained to evaluate the vascular anatomy. CONTRAST:  100 mL Isovue 370 COMPARISON:  None. FINDINGS: Cardiovascular: Satisfactory opacification of the pulmonary arteries to the segmental level. No evidence of pulmonary embolism. Normal heart size. No pericardial effusion. Thoracic aorta is normal in caliber. Mediastinum/Nodes: No enlarged mediastinal, hilar, or axillary lymph nodes. Thyroid gland, trachea, and esophagus demonstrate no significant findings. Lungs/Pleura: Small bilateral pleural effusions. Bibasilar atelectasis. Bilateral perihilar patchy airspace disease. More consolidated airspace disease in the posterior segment of the right upper lobe. Upper Abdomen: No acute upper abdominal abnormality. Musculoskeletal: No  acute osseous abnormality. No lytic or sclerotic osseous lesion. Review of the MIP images confirms the above findings. IMPRESSION: 1. No evidence of pulmonary embolus. 2. Pulmonary edema. 3. More consolidated airspace disease in the posterior segment of the right upper lobe concerning for superimposed pneumonia. Electronically Signed   By: Elige Ko   On: 10/22/2016 11:08        Scheduled Meds: . aspirin EC  81 mg Oral Daily  . docusate sodium  100 mg Oral BID  . furosemide  20 mg Intravenous Q12H  . lisinopril  2.5 mg Oral Daily  . mouth rinse  15 mL Mouth Rinse BID  . metoprolol succinate  25 mg Oral Daily  . potassium chloride  20  mEq Oral BID  . simvastatin  40 mg Oral q1800  . sodium chloride flush  3 mL Intravenous Q12H  . terbinafine   Topical Daily   Continuous Infusions: . sodium chloride    . heparin 1,700 Units/hr (10/24/16 0700)  . methocarbamol (ROBAXIN)  IV       LOS: 3 days    Time spent: 40 minutes    WOODS, Roselind MessierURTIS J, MD [facility] Pager [phone]   If 7PM-7AM, please contact night-coverage [email or web address] Password [email or web address] 10/24/2016, 7:35 AM

## 2016-10-25 DIAGNOSIS — I2510 Atherosclerotic heart disease of native coronary artery without angina pectoris: Secondary | ICD-10-CM

## 2016-10-25 DIAGNOSIS — I5043 Acute on chronic combined systolic (congestive) and diastolic (congestive) heart failure: Secondary | ICD-10-CM

## 2016-10-25 LAB — CBC
HEMATOCRIT: 36.7 % — AB (ref 39.0–52.0)
Hemoglobin: 12.4 g/dL — ABNORMAL LOW (ref 13.0–17.0)
MCH: 33.5 pg (ref 26.0–34.0)
MCHC: 33.8 g/dL (ref 30.0–36.0)
MCV: 99.2 fL (ref 78.0–100.0)
Platelets: 347 10*3/uL (ref 150–400)
RBC: 3.7 MIL/uL — AB (ref 4.22–5.81)
RDW: 11.8 % (ref 11.5–15.5)
WBC: 12.9 10*3/uL — AB (ref 4.0–10.5)

## 2016-10-25 LAB — BASIC METABOLIC PANEL
Anion gap: 9 (ref 5–15)
BUN: 13 mg/dL (ref 6–20)
CO2: 27 mmol/L (ref 22–32)
Calcium: 9.1 mg/dL (ref 8.9–10.3)
Chloride: 98 mmol/L — ABNORMAL LOW (ref 101–111)
Creatinine, Ser: 0.85 mg/dL (ref 0.61–1.24)
GFR calc Af Amer: >60 mL/min (ref >60)
GFR calc non Af Amer: >60 mL/min (ref >60)
Glucose, Bld: 103 mg/dL — ABNORMAL HIGH (ref 65–99)
Potassium: 4.5 mmol/L (ref 3.5–5.1)
Sodium: 134 mmol/L — ABNORMAL LOW (ref 135–145)

## 2016-10-25 MED ORDER — FUROSEMIDE 20 MG PO TABS
20.0000 mg | ORAL_TABLET | Freq: Every day | ORAL | Status: DC
  Administered 2016-10-26 – 2016-10-27 (×2): 20 mg via ORAL
  Filled 2016-10-25 (×2): qty 1

## 2016-10-25 MED ORDER — SERTRALINE HCL 50 MG PO TABS
25.0000 mg | ORAL_TABLET | Freq: Every day | ORAL | Status: DC
  Administered 2016-10-25 – 2016-10-26 (×2): 25 mg via ORAL
  Filled 2016-10-25 (×2): qty 1

## 2016-10-25 NOTE — Progress Notes (Signed)
Physical Therapy Treatment Patient Details Name: Eduardo [name] MRN: [id number] DOB: 1966-01-01 Today's Date: 10/25/2016    History of Present Illness 51 y.o. male with CAD/prior MI/prior stenting of the Circumflex in 2008, tobacco abuse admitted after surgery for left tib/fib fracture (IM nail). Pt with chest pain post op with NSTEMI and was seen by Dr. Excell Seltzer. No EKG changes so planned for outpatient stress test. He had increased SOB 10/16 around 4 am and reported pulmonary edema. Troponin elevated at 5.0. Underwent cardiac cath , 3v disease with EF of 20%.     PT Comments    Pt admitted with above diagnosis. Pt currently with functional limitations due to balance and endurance deficits. Pt was able to ambulate with RW with min guard assist with ability to maintain NWB left LE.  HR up to 128 bpm.   Will continue to follow acutely.   Pt will benefit from skilled PT to increase their independence and safety with mobility to allow discharge to the venue listed below.     Follow Up Recommendations  Home health PT;Supervision/Assistance - 24 hour     Equipment Recommendations  Rolling walker with 5" wheels    Recommendations for Other Services       Precautions / Restrictions Precautions Precautions: Fall Restrictions Weight Bearing Restrictions: Yes RUE Weight Bearing:  (r radial cath site) LLE Weight Bearing: Non weight bearing    Mobility  Bed Mobility Overal bed mobility: Needs Assistance Bed Mobility: Supine to Sit     Supine to sit: Min guard     General bed mobility comments: No assist needed. Incr time given  Transfers Overall transfer level: Needs assistance Equipment used: Rolling walker (2 wheeled) Transfers: Sit to/from [employer] Sit to Stand: Min guard;Supervision         General transfer comment: No assist with power up and steady once up to RW. Pt was able to maintain NWB left LE.    Ambulation/Gait Ambulation/Gait assistance: Min  guard;Supervision;+2 safety/equipment Ambulation Distance (Feet): 65 Feet Assistive device: Rolling walker (2 wheeled) Gait Pattern/deviations: Step-to pattern;Decreased stride length   Gait velocity interpretation: Below normal speed for age/gender General Gait Details: Pt was able to ambulate with RW with good safety. Recommend RW for home for pt and he has one.  He still wants to use crutches but informed pt that RW is safer and will require less endurance.     Stairs            Wheelchair Mobility    Modified Rankin (Stroke Patients Only)       Balance Overall balance assessment: Needs assistance Sitting-balance support: No upper extremity supported;Feet supported Sitting balance-Leahy Scale: Good     Standing balance support: During functional activity;Bilateral upper extremity supported Standing balance-Leahy Scale: Poor Standing balance comment: Pt relies on UEs for support due to left LE NWB                            Cognition Arousal/Alertness: Awake/alert Behavior During Therapy: Flat affect;Anxious Overall Cognitive Status: Within Functional Limits for tasks assessed                                        Exercises General Exercises - Lower Extremity Ankle Circles/Pumps: AROM;10 reps;Supine;Right Long Arc Quad: AROM;Both;10 reps;Seated    General Comments General comments (skin integrity, edema,  etc.): VSS with HR up to 128 bpm with activity.       Pertinent Vitals/Pain Pain Assessment: Faces Faces Pain Scale: Hurts little more Pain Location: left LE Pain Descriptors / Indicators: Aching;Grimacing;Guarding Pain Intervention(s): Limited activity within patient's tolerance;Monitored during session;Premedicated before session;Repositioned    Home Living                      Prior Function            PT Goals (current goals can now be found in the care plan section) Progress towards PT goals: Progressing  toward goals    Frequency    Min 3X/week      PT Plan Current plan remains appropriate    Co-evaluation              AM-PAC PT "6 Clicks" Daily Activity  Outcome Measure  Difficulty turning over in bed (including adjusting bedclothes, sheets and blankets)?: A Little Difficulty moving from lying on back to sitting on the side of the bed? : A Little Difficulty sitting down on and standing up from a chair with arms (e.g., wheelchair, bedside commode, etc,.)?: A Little Help needed moving to and from a bed to chair (including a wheelchair)?: A Little Help needed walking in hospital room?: A Little Help needed climbing 3-5 steps with a railing? : Total 6 Click Score: 16    End of Session Equipment Utilized During Treatment: Gait belt Activity Tolerance: Patient limited by fatigue;Patient limited by pain Patient left: in chair;with call bell/phone within reach Nurse Communication: Mobility status PT Visit Diagnosis: Muscle weakness (generalized) (M62.81);Pain;Unsteadiness on feet (R26.81) Pain - Right/Left: Left Pain - part of body: Leg     Time: [id number] PT Time Calculation (min) (ACUTE ONLY): 10 min  Charges:  $Gait Training: 8-22 mins                    G Codes:       Eduardo [name],PT Acute Rehabilitation [phone] (pager)    Berline Lopesawn F Wafaa Deemer 10/25/2016, 1:58 PM

## 2016-10-25 NOTE — Care Management Note (Signed)
Case Management Note Donn PieriniKristi Pariss Hommes RN, BSN Unit 4E-Case Manager-- 2H coverage [phone]  Patient Details  Name: Eduardo [name] A Kisamore MRN: [id number] Date of Birth: 09/17/1965  Subjective/Objective:   Pt admitted with leg pain- Left Tib/Fib fracture s/p IM rod-10/15  Post op with Elevated troponin in setting of flash pulmonary edema in pt with known CAD- NSTEMI- s/p cath on 10/16- pt with severe 3VD- TCTS consulted.                  Action/Plan: PTA pt lived at home, - T eval pending- CM to follow for d/c needs.   Expected Discharge Date:                  Expected Discharge Plan:  Home w Home Health Services  In-House Referral:  Clinical Social Work  Discharge planning Services  CM Consult  Post Acute Care Choice:  Durable Medical Equipment, Home Health Choice offered to:  Patient  DME Arranged:  Life vest DME Agency:  [facility]  HH Arranged:    HH Agency:     Status of Service:  In process, will continue to follow  If discussed at Long Length of Stay Meetings, dates discussed:    Discharge Disposition: home/home health   Additional Comments:  10/25/16- 1400- Donn PieriniKristi Zaliyah Meikle RN, CM- referral for home LifeVest received- spoke with Rosanne AshingJim at [facility]- who has already received order form for Life Vest pt's demographics faxed to [facility] via epic to [phone]- per request for Life Vest approval process- pt's insurance can take up to 72 hr to approve LifeVest for home- CM will follow- [facility] will need orders prior to discharge.   Darrold SpanWebster, Meyah Corle Hall, RN 10/25/2016, 2:09 PM

## 2016-10-25 NOTE — Progress Notes (Signed)
Subjective: 4 Days Post-Op Procedure(s) (LRB): INTRAMEDULLARY (IM) ROD TIBIA/FIBULA FRACTURE (Left) Patient reports pain as mild. Ambulated with walker today. His therapist is having him bring crutches to try tomorrow.  Objective: Vital signs in last 24 hours: Temp:  [98.1 F (36.7 C)-98.5 F (36.9 C)] 98.5 F (36.9 C) (10/19 1620) Pulse Rate:  [82-105] 88 (10/19 1620) Resp:  [14-28] 16 (10/19 1620) BP: (95-123)/(65-89) 109/75 (10/19 1620) SpO2:  [82 %-99 %] 96 % (10/19 1620) Weight:  [72.9 kg (160 lb 11.2 oz)] 72.9 kg (160 lb 11.2 oz) (10/19 0500)  Intake/Output from previous day: 10/18 0701 - 10/19 0700 In: 497 [P.O.:480; I.V.:17] Out: 3725 [Urine:3525; Stool:200] Intake/Output this shift: No intake/output data recorded.   Recent Labs  10/23/16 0552 10/24/16 0220 10/25/16 0532  HGB 11.8* 11.5* 12.4*    Recent Labs  10/24/16 0220 10/25/16 0532  WBC 15.0* 12.9*  RBC 3.40* 3.70*  HCT 33.7* 36.7*  PLT 310 347    Recent Labs  10/24/16 0220 10/25/16 0535  NA 135 134*  K 3.8 4.5  CL 99* 98*  CO2 28 27  BUN 10 13  CREATININE 0.79 0.85  GLUCOSE 100* 103*  CALCIUM 8.4* 9.1   No results for input(s): LABPT, INR in the last 72 hours. Left lower extremity exam: Posterior splint intact. Moves toes actively. Dressing clean and dry.  Assessment/Plan: 4 Days Post-Op Procedure(s) (LRB): INTRAMEDULLARY (IM) ROD TIBIA/FIBULA FRACTURE (Left) Plan: Up with therapy touchdown weightbearing on left. Elevate left leg as much as possible. Aspirin for DVT prophylaxis as well as for his cardiac issues. We will sign off. We appreciate greatly the internal medicine, hospitalist, cardiology assistance. He will follow-up with Dr. Luiz BlareGraves in the office on Tuesday 11/05/16. The patient will need to call for an appointment. He is aware of this. Octavio Matheney G 10/25/2016, 7:50 PM

## 2016-10-25 NOTE — Progress Notes (Signed)
Progress Note  Patient Name: Eduardo [name] Date of Encounter: 10/25/2016  Primary Cardiologist: Ellis ParentsNew Excell Seltzer(Evangelina Delancey)  Subjective   The patient is feeling better today. He denies chest pain or shortness of breath at rest. He did have some pain with breathing yesterday, but this has resolved. He was irritated last night with the pulse oximetry alarm.  Inpatient Medications    Scheduled Meds: . aspirin EC  81 mg Oral Daily  . atorvastatin  80 mg Oral q1800  . docusate sodium  100 mg Oral BID  . enoxaparin (LOVENOX) injection  40 mg Subcutaneous Q24H  . furosemide  20 mg Intravenous Q12H  . lisinopril  2.5 mg Oral Daily  . mouth rinse  15 mL Mouth Rinse BID  . metoprolol succinate  25 mg Oral Daily  . potassium chloride  20 mEq Oral BID  . sodium chloride flush  3 mL Intravenous Q12H  . spironolactone  12.5 mg Oral Daily  . terbinafine   Topical Daily   Continuous Infusions: . sodium chloride    . methocarbamol (ROBAXIN)  IV     PRN Meds: sodium chloride, [DISCONTINUED] acetaminophen **OR** acetaminophen, acetaminophen, bisacodyl, HYDROmorphone (DILAUDID) injection, methocarbamol **OR** methocarbamol (ROBAXIN)  IV, ondansetron **OR** ondansetron (ZOFRAN) IV, oxyCODONE, polyethylene glycol, sodium chloride flush, zolpidem   Vital Signs    Vitals:   10/25/16 0600 10/25/16 0700 10/25/16 0800 10/25/16 0900  BP: 106/78 103/77 111/79 123/87  Pulse: 86 87 82 91  Resp: 17 20 20    Temp:   98.4 F (36.9 C)   TempSrc:   Oral   SpO2: 99% 98% 99% 97%  Weight:      Height:        Intake/Output Summary (Last 24 hours) at 10/25/16 1000 Last data filed at 10/25/16 0900  Gross per 24 hour  Intake              240 ml  Output             3850 ml  Net            -3610 ml   Filed Weights   10/23/16 0801 10/24/16 0500 10/25/16 0500  Weight: 170 lb 6.7 oz (77.3 kg) 164 lb 0.4 oz (74.4 kg) 160 lb 11.2 oz (72.9 kg)    Telemetry    Sinus rhythm, no arrhythmia - Personally  Reviewed   Physical Exam  [name], oriented male GEN: No acute distress.   Neck: No JVD Cardiac: RRR, no murmurs, rubs, or gallops.  Respiratory: Clear to auscultation bilaterally. GI: Soft, nontender, non-distended  MS: No edema; No deformity. Left leg braces in place Neuro:  Nonfocal  Psych: Normal affect   Labs    Chemistry Recent Labs Lab 10/23/16 0552 10/24/16 0220 10/25/16 0535  NA 131* 135 134*  K 3.5 3.8 4.5  CL 95* 99* 98*  CO2 26 28 27   GLUCOSE 101* 100* 103*  BUN 8 10 13   CREATININE 0.77 0.79 0.85  CALCIUM 8.5* 8.4* 9.1  GFRNONAA >60 >60 >60  GFRAA >60 >60 >60  ANIONGAP 10 8 9      Hematology Recent Labs Lab 10/23/16 0552 10/24/16 0220 10/25/16 0532  WBC 17.6* 15.0* 12.9*  RBC 3.42* 3.40* 3.70*  HGB 11.8* 11.5* 12.4*  HCT 33.6* 33.7* 36.7*  MCV 98.2 99.1 99.2  MCH 34.5* 33.8 33.5  MCHC 35.1 34.1 33.8  RDW 11.7 11.7 11.8  PLT 301 310 347    Cardiac Enzymes Recent Labs Lab 10/22/16 0416  TROPONINI 5.00*   No results for input(s): TROPIPOC in the last 168 hours.   BNP Recent Labs Lab 10/22/16 0417  BNP 233.5*     DDimer  Recent Labs Lab 10/22/16 0416  DDIMER 11.13*     Radiology    No results found.   Patient Profile     51 y.o. male with non-STEMI and acute heart failure following orthopedic surgery, found to have severe multivessel disease and severe LV dysfunction  Assessment & Plan    1. Non-STEMI: No symptoms of recurrent ischemia. Will continue current medical therapy which includes aspirin for antiplatelet therapy, a beta blocker, and ACE inhibitor, and a high intensity statin. Will avoid clopidogrel as the patient will require surgical revascularization when he is clinically improved and agreeable to move forward with surgery.  2. Acute systolic heart failure: The patient is better compensated. Will wean him off of oxygen today as tolerated. He does not have any further evidence of volume overload. The patient is Lasix  nave and will convert him to furosemide 20 mg daily. Will continue lisinopril, metoprolol succinate, and spironolactone.  3. Hyperlipidemia: The patient is on a high intensity statin drug.  4. Severe mixed ischemic and nonischemic cardiomyopathy: Alcohol counseling has been done. He understands the need to quit completely. Today I discussed a LifeVest with him considering his residual ischemic burden and severe LV dysfunction. He is agreeable. We'll place a consult. I would like him to wear this until he undergoes revascularization or his LV function improves to an LVEF greater than 35%.  Disposition:Patient is stable from a cardiac perspective to transfer to a telemetry bed. I will place the order for LifeVest. We discussed his depression and will start him on an SSRI. Anticipate stability for DC home in 24-48 hours  For questions or updates, please contact [facility] Please consult [email or web address] for contact info under Cardiology/STEMI.      Enzo Bi, MD  10/25/2016, 10:00 AM

## 2016-10-25 NOTE — Progress Notes (Signed)
PROGRESS NOTE    KAREL TURPEN  ZOX:[id number] DOB: January 10, 1965 DOA: 10/21/2016 PCP: Patient, No Pcp Per   Brief Narrative:   51 y.o. WM PMHx Anxiety, MI July 2008,Tobacco abuse  Presents for evaluation of Left leg pain after injury where he suffered a left tib-fib fracture.. It has been present for Less than a week and has been worsening. He has failed conservative measures. Pain is rated as severe.   POD from ORIF of L tib/fib fx when he had CP and Cardiology was consulted.  At the time of Dr. Earmon Phoenix evaluation his pain had resolved.  That night/early next morning at about 4am he was awoken from sleep by sudden onset severe SOB, and a cough productive of pink frothy sputum.  His O2 sat dropped, and his was in 150s sinus.  He denied CP, jaw pain, left arm pain.   EKG noted ST changes in 2,3,AVF.  Cards fellow didn't feel it was a STEMI.  CXR showed pulmonary vascular congestion and pulmonary edema.  His respiratory status was stabilized with BIPAP.    Subjective: 10/19 A/O 4, Negative SOB, negative CP, negative abdominal pain, negative N/V. States ambulated in the hallway with walker did not place weight on left leg.        Assessment & Plan:   Principal Problem:   Displaced comminuted fracture of shaft of left tibia, initial encounter for closed fracture Active Problems:   Left tibial fracture   Acute respiratory failure with hypoxia (HCC)   Flash pulmonary edema (HCC)   Non-ST elevation (NSTEMI) myocardial infarction [facility])   Coronary artery disease involving native coronary artery of native heart without angina pectoris   Acute on chronic combined systolic and diastolic CHF (congestive heart failure) (HCC)   Acute pulmonary edema (HCC)   Elevated troponin   Coronary artery disease involving native coronary artery   Displaced LEFT TIB FIB fx - S/P ORIF -Per Orthopedic: may start ambulating nonweightbearing on LLE -schedule follow-up at Dr. Jodi Geralds office 10 days  for dressing change left tib-fib/fib fracture   Chronic Systolic and Diastolic CHF -unknown base weight -Strict in and out since admission -6.1 L -Daily weight Filed Weights   10/23/16 0801 10/24/16 0500 10/25/16 0500  Weight: 170 lb 6.7 oz (77.3 kg) 164 lb 0.4 oz (74.4 kg) 160 lb 11.2 oz (72.9 kg)  -follow up with Dr. Colvin Caroli PA Thomasenia Bottoms on 11/2 @ 0 9:15 -Lipitor 80 mg daily -Lasix 20 mg daily -lisinopril 2.5 mg daily -spironolactone 12.5 mg daily  Severe three-vessel disease -Cardiology recommends CABG. Patient states DOES NOT want CABG. -Cardiothoracic Surgery recommends CABG once patient medically stable. Patient continues to refuse CABG.    Acute Respiratory Failure with Hypoxia/Acute Pulmonary Edema  -CT angiogram PE protocol negative for PE, however did show possible superimposed pneumonia See results below -continue to monitor off antibiotics Patient doing well no signs or symptoms of acute infection -Leukocytosis continues to decrease   Elevated troponin/CAD -see severe three-vessel disease   Hypokalemia -potassium goal> 4  Hypomagnesemia -magnesium goal> 2     DVT prophylaxis: Heparin drip Code Status: full Family Communication: none Disposition Plan: per cardiology/cardiothoracic surgery   Consultants:  Cardiology Cardiothoracic surgery   Procedures/Significant Events:  10/15 INTRAMEDULLARY (IM) ROD TIBIA/FIBULA FRACTURE (Left) 10/16 Echocardiogram:LVEF= 20%.Diffuse hypokinesis. -Grade 2 Diastolic dysfunction). 10/16 LEFT Heart Cardiac Catheterization:-Acute on chronic systolic heart failure with LVEDP 35 mmHg.  -Leftventriculography was not performed but echo done earlier today demonstrated EF less than 20%. -  Severe three-vessel coronary disease with total occlusion of the proximal circumflex and the previously stented segment. Faint left to left collaterals are noted. -Severe diffuse disease in the proximal and mid LAD. -Severe mid RCA  stenosis. 10/16 CT angiogram PE protocol:negative PE.-pulmonary edema.-Consolidative airspace disease posterior segment RUL concerning for superimposed pneumonia   I have personally reviewed and interpreted all radiology studies and my findings are as above.  VENTILATOR SETTINGS: none   Cultures 10/16 MRSA by PCR negative  Antimicrobials: Anti-infectives    Start     Stop   10/21/16 2000  ceFAZolin (ANCEF) IVPB 2g/100 mL premix     10/22/16 1430   10/21/16 0932  clindamycin (CLEOCIN) 900 MG/50ML IVPB    Comments:  Kathrene Bongo   : cabinet override   10/21/16 1140   10/21/16 0924  clindamycin (CLEOCIN) IVPB 900 mg     10/21/16 1210       Devices   LINES / TUBES:      Continuous Infusions: . sodium chloride    . methocarbamol (ROBAXIN)  IV       Objective: Vitals:   10/25/16 0700 10/25/16 0800 10/25/16 0900 10/25/16 1137  BP: 103/77 111/79 123/87   Pulse: 87 82 91   Resp: 20 20    Temp:  98.4 F (36.9 C)  98.1 F (36.7 C)  TempSrc:  Oral  Oral  SpO2: 98% 99% 97%   Weight:      Height:        Intake/Output Summary (Last 24 hours) at 10/25/16 1241 Last data filed at 10/25/16 0900  Gross per 24 hour  Intake              240 ml  Output             3850 ml  Net            -3610 ml   Filed Weights   10/23/16 0801 10/24/16 0500 10/25/16 0500  Weight: 170 lb 6.7 oz (77.3 kg) 164 lb 0.4 oz (74.4 kg) 160 lb 11.2 oz (72.9 kg)     Physical Exam:  General: A/O 4, negative acute respiratory distress Neck:  Negative scars, masses, torticollis, lymphadenopathy, JVD Lungs: Clear to auscultation bilaterally without wheezes or crackles Cardiovascular: Regular rate and rhythm without murmur gallop or rub normal S1 and S2 Abdomen: negative abdominal pain, nondistended, positive soft, bowel sounds, no rebound, no ascites, no appreciable mass Extremities: No significant cyanosis, clubbing. LLE in a splint wrapped with Ace bandage did not take down. Skin:  Negative rashes, lesions, ulcers Psychiatric:  Negative depression, negative anxiety, negative fatigue, negative mania , patient does not appear to have a good understanding of the seriousness of his diagnosis. Central nervous system:  Cranial nerves II through XII intact, tongue/uvula midline, all extremities muscle strength 5/5, sensation intact throughout, negative dysarthria, negative expressive aphasia, negative receptive aphasia.   CBC:  Recent Labs Lab 10/21/16 0926 10/22/16 0416 10/23/16 0552 10/24/16 0220 10/25/16 0532  WBC 13.3* 22.3* 17.6* 15.0* 12.9*  HGB 12.5* 13.2 11.8* 11.5* 12.4*  HCT 37.2* 38.6* 33.6* 33.7* 36.7*  MCV 100.0 101.0* 98.2 99.1 99.2  PLT 321 361 301 310 347   Basic Metabolic Panel:  Recent Labs Lab 10/21/16 0926 10/22/16 0416 10/23/16 0552 10/24/16 0220 10/25/16 0535  NA 140 133* 131* 135 134*  K 4.1 4.3 3.5 3.8 4.5  CL 104 97* 95* 99* 98*  CO2 26 26 26 28 27   GLUCOSE 104* 193* 101* 100*  103*  BUN 10 10 8 10 13   CREATININE 0.84 0.85 0.77 0.79 0.85  CALCIUM 9.1 8.7* 8.5* 8.4* 9.1  MG  --   --  1.6* 1.8  --    GFR: Estimated Creatinine Clearance: 94 mL/min (by C-G formula based on SCr of 0.85 mg/dL). Liver Function Tests: No results for input(s): AST, ALT, ALKPHOS, BILITOT, PROT, ALBUMIN in the last 168 hours. No results for input(s): LIPASE, AMYLASE in the last 168 hours. No results for input(s): AMMONIA in the last 168 hours. Coagulation Profile:  Recent Labs Lab 10/22/16 1118  INR 1.03   Cardiac Enzymes:  Recent Labs Lab 10/22/16 0416  TROPONINI 5.00*   BNP (last 3 results) No results for input(s): PROBNP in the last 8760 hours. HbA1C: No results for input(s): HGBA1C in the last 72 hours. CBG: No results for input(s): GLUCAP in the last 168 hours. Lipid Profile: No results for input(s): CHOL, HDL, LDLCALC, TRIG, CHOLHDL, LDLDIRECT in the last 72 hours. Thyroid Function Tests: No results for input(s): TSH, T4TOTAL,  FREET4, T3FREE, THYROIDAB in the last 72 hours. Anemia Panel: No results for input(s): VITAMINB12, FOLATE, FERRITIN, TIBC, IRON, RETICCTPCT in the last 72 hours. Urine analysis: No results found for: COLORURINE, APPEARANCEUR, LABSPEC, PHURINE, GLUCOSEU, HGBUR, BILIRUBINUR, KETONESUR, PROTEINUR, UROBILINOGEN, NITRITE, LEUKOCYTESUR Sepsis Labs: @LABRCNTIP (procalcitonin:4,lacticidven:4)  ) Recent Results (from the past 240 hour(s))  Surgical pcr screen     Status: None   Collection Time: 10/21/16 10:06 AM  Result Value Ref Range Status   MRSA, PCR NEGATIVE NEGATIVE Final   Staphylococcus aureus NEGATIVE NEGATIVE Final    Comment: (NOTE) The Xpert SA Assay (FDA approved for NASAL specimens in patients 53 years of age and older), is one component of a comprehensive surveillance program. It is not intended to diagnose infection nor to guide or monitor treatment.   MRSA PCR Screening     Status: None   Collection Time: 10/22/16  4:23 AM  Result Value Ref Range Status   MRSA by PCR NEGATIVE NEGATIVE Final    Comment:        The GeneXpert MRSA Assay (FDA approved for NASAL specimens only), is one component of a comprehensive MRSA colonization surveillance program. It is not intended to diagnose MRSA infection nor to guide or monitor treatment for MRSA infections.          Radiology Studies: No results found.      Scheduled Meds: . aspirin EC  81 mg Oral Daily  . atorvastatin  80 mg Oral q1800  . docusate sodium  100 mg Oral BID  . enoxaparin (LOVENOX) injection  40 mg Subcutaneous Q24H  . [START ON 10/26/2016] furosemide  20 mg Oral Daily  . lisinopril  2.5 mg Oral Daily  . mouth rinse  15 mL Mouth Rinse BID  . metoprolol succinate  25 mg Oral Daily  . potassium chloride  20 mEq Oral BID  . sertraline  25 mg Oral QHS  . sodium chloride flush  3 mL Intravenous Q12H  . spironolactone  12.5 mg Oral Daily  . terbinafine   Topical Daily   Continuous Infusions: .  sodium chloride    . methocarbamol (ROBAXIN)  IV       LOS: 4 days    Time spent: 40 minutes    WOODS, Roselind MessierURTIS J, MD [facility] Pager [phone]   If 7PM-7AM, please contact night-coverage [email or web address] Password [email or web address] 10/25/2016, 12:41 PM

## 2016-10-25 NOTE — Progress Notes (Signed)
Patient ID: Eduardo [name], male   DOB: 04/22/1965, 51 y.o.   MRN: [id number] TCTS DAILY ICU PROGRESS NOTE                   [address]            [place] [id number]          [phone]   3 Days Post-Op Procedure(s) (LRB): LEFT HEART CATH AND CORONARY ANGIOGRAPHY (N/A)  Total Length of Stay:  LOS: 4 days   Subjective: Complaining of left leg, no dt's evident   Objective: Vital signs in last 24 hours: Temp:  [98.1 F (36.7 C)-98.7 F (37.1 C)] 98.2 F (36.8 C) (10/19 0400) Pulse Rate:  [82-105] 86 (10/19 0600) Cardiac Rhythm: Normal sinus rhythm (10/19 0400) Resp:  [15-28] 17 (10/19 0600) BP: (95-112)/(68-89) 106/78 (10/19 0600) SpO2:  [82 %-99 %] 99 % (10/19 0600) Weight:  [160 lb 11.2 oz (72.9 kg)] 160 lb 11.2 oz (72.9 kg) (10/19 0500)  Filed Weights   10/23/16 0801 10/24/16 0500 10/25/16 0500  Weight: 170 lb 6.7 oz (77.3 kg) 164 lb 0.4 oz (74.4 kg) 160 lb 11.2 oz (72.9 kg)    Weight change: -9 lb 11.5 oz (-4.407 kg)   Hemodynamic parameters for last 24 hours:    Intake/Output from previous day: 10/18 0701 - 10/19 0700 In: 497 [P.O.:480; I.V.:17] Out: 2725 [Urine:2525; Stool:200]  Intake/Output this shift: No intake/output data recorded.  Current Meds: Scheduled Meds: . aspirin EC  81 mg Oral Daily  . atorvastatin  80 mg Oral q1800  . docusate sodium  100 mg Oral BID  . enoxaparin (LOVENOX) injection  40 mg Subcutaneous Q24H  . furosemide  20 mg Intravenous Q12H  . lisinopril  2.5 mg Oral Daily  . mouth rinse  15 mL Mouth Rinse BID  . metoprolol succinate  25 mg Oral Daily  . potassium chloride  20 mEq Oral BID  . sodium chloride flush  3 mL Intravenous Q12H  . spironolactone  12.5 mg Oral Daily  . terbinafine   Topical Daily   Continuous Infusions: . sodium chloride    . methocarbamol (ROBAXIN)  IV     PRN Meds:.sodium chloride, [DISCONTINUED] acetaminophen **OR** acetaminophen, acetaminophen, bisacodyl, HYDROmorphone (DILAUDID)  injection, methocarbamol **OR** methocarbamol (ROBAXIN)  IV, ondansetron **OR** ondansetron (ZOFRAN) IV, oxyCODONE, polyethylene glycol, sodium chloride flush, zolpidem  General appearance: alert and cooperative Neurologic: intact Heart: regular rate and rhythm, S1, S2 normal, no murmur, click, rub or gallop Lungs: diminished breath sounds bibasilar Abdomen: soft, non-tender; bowel sounds normal; no masses,  no organomegaly Extremities: extremities normal, atraumatic, no cyanosis or edema and Homans sign is negative, no sign of DVT Wound: left leg dressed toes viable   Lab Results: CBC: Recent Labs  10/24/16 0220 10/25/16 0532  WBC 15.0* 12.9*  HGB 11.5* 12.4*  HCT 33.7* 36.7*  PLT 310 347   BMET:  Recent Labs  10/24/16 0220 10/25/16 0535  NA 135 134*  K 3.8 4.5  CL 99* 98*  CO2 28 27  GLUCOSE 100* 103*  BUN 10 13  CREATININE 0.79 0.85  CALCIUM 8.4* 9.1    CMET: Lab Results  Component Value Date   WBC 12.9 (H) 10/25/2016   HGB 12.4 (L) 10/25/2016   HCT 36.7 (L) 10/25/2016   PLT 347 10/25/2016   GLUCOSE 103 (H) 10/25/2016   NA 134 (L) 10/25/2016   K 4.5 10/25/2016   CL 98 (L) 10/25/2016  CREATININE 0.85 10/25/2016   BUN 13 10/25/2016   CO2 27 10/25/2016   INR 1.03 10/22/2016      PT/INR:  Recent Labs  10/22/16 1118  LABPROT 13.4  INR 1.03   Radiology: No results found.   Assessment/Plan: S/P Procedure(s) (LRB): LEFT HEART CATH AND CORONARY ANGIOGRAPHY (N/A)  Declining cabg at this point wants to go home , will need aggressive HF treatment and avoid alcohol completely and hopefully lv will improve and he will agree with cabg    Delight Ovens 10/25/2016 8:59 AM

## 2016-10-26 DIAGNOSIS — I255 Ischemic cardiomyopathy: Secondary | ICD-10-CM

## 2016-10-26 DIAGNOSIS — S82252A Displaced comminuted fracture of shaft of left tibia, initial encounter for closed fracture: Principal | ICD-10-CM

## 2016-10-26 DIAGNOSIS — Z419 Encounter for procedure for purposes other than remedying health state, unspecified: Secondary | ICD-10-CM

## 2016-10-26 DIAGNOSIS — S82102A Unspecified fracture of upper end of left tibia, initial encounter for closed fracture: Secondary | ICD-10-CM

## 2016-10-26 LAB — TSH: TSH: 4.432 u[IU]/mL (ref 0.350–4.500)

## 2016-10-26 LAB — CBC
HCT: 38.6 % — ABNORMAL LOW (ref 39.0–52.0)
Hemoglobin: 13.5 g/dL (ref 13.0–17.0)
MCH: 34 pg (ref 26.0–34.0)
MCHC: 35 g/dL (ref 30.0–36.0)
MCV: 97.2 fL (ref 78.0–100.0)
PLATELETS: 403 10*3/uL — AB (ref 150–400)
RBC: 3.97 MIL/uL — ABNORMAL LOW (ref 4.22–5.81)
RDW: 11.6 % (ref 11.5–15.5)
WBC: 14.4 10*3/uL — ABNORMAL HIGH (ref 4.0–10.5)

## 2016-10-26 LAB — LIPASE, BLOOD: Lipase: 22 U/L (ref 11–51)

## 2016-10-26 MED ORDER — SIMETHICONE 80 MG PO CHEW
80.0000 mg | CHEWABLE_TABLET | Freq: Once | ORAL | Status: AC
  Administered 2016-10-26: 80 mg via ORAL
  Filled 2016-10-26: qty 1

## 2016-10-26 MED ORDER — CLOPIDOGREL BISULFATE 75 MG PO TABS
75.0000 mg | ORAL_TABLET | Freq: Every day | ORAL | Status: DC
  Administered 2016-10-26 – 2016-10-27 (×2): 75 mg via ORAL
  Filled 2016-10-26 (×2): qty 1

## 2016-10-26 NOTE — Progress Notes (Signed)
PROGRESS NOTE  Eduardo [name] ZOX:[id number] DOB: 12-04-1965 DOA: 10/21/2016 PCP: Patient, No Pcp Per  HPI/Recap of past 24 hours: Mr. Cassarino is a 51 year old Caucasian male with past medical history significant for Coronary artery disease status post stenting, tobacco and alcohol use disorder who presented to [facility] on 10/21/2016 after a fall that resulted in left tibial fibula fracture. Patient was admitted by the orthopedic service and is POD #5 post intramedullary rod fot Tibia fibula fracture. On 10/22/2016 patient had an NSTEMI and subsequently had a a left heart catheterization. Transthoracic echocardiogram done on 10/22/2016 revealed EF of 20%. Promptly plans were made for a LifeVest per cardiology request.  Overnight there were no acute events.  This morning the patient reports persistent gas with no nausea or vomiting or diarrhea.  He denies any chest pain, dyspnea, or palpitations.  General: Alert and oriented x4, no acute distress Cardiovascular: Regular rate and rhythm with no murmurs rubs or gallops Respiratory: No cyanosis, no use of accessory musculature GI: No organomegaly, abdomen is soft and non-tender Skin: No lesions in the area of chief complaint Neurologic: Sensation intact distally.  Able to move all limbs.  No motor deficit noted. Psychiatric: Mood is appropriate for condition and circumstances Lymphatic: No axillary or cervical lymphadenopathy MUSCULOSKELETAL: Left leg in a surgical cast.  Moves his toes freely.   Objective: Vitals:   10/25/16 1955 10/26/16 0020 10/26/16 0500 10/26/16 0742  BP: 110/83 98/65 108/73 (!) 84/63  Pulse: 97 93 94 62  Resp: 18 18 18 18   Temp: 98.1 F (36.7 C) 99.1 F (37.3 C) 97.6 F (36.4 C) 97.6 F (36.4 C)  TempSrc: Oral Oral Oral Oral  SpO2: 96% 99% 94% 95%  Weight:   70.9 kg (156 lb 6.4 oz)   Height:        Intake/Output Summary (Last 24 hours) at 10/26/16 0818 Last data filed at 10/26/16 0509  Gross per 24 hour   Intake             1143 ml  Output             1650 ml  Net             -507 ml   Filed Weights   10/24/16 0500 10/25/16 0500 10/26/16 0500  Weight: 74.4 kg (164 lb 0.4 oz) 72.9 kg (160 lb 11.2 oz) 70.9 kg (156 lb 6.4 oz)     Data Reviewed: CBC:  Recent Labs Lab 10/22/16 0416 10/23/16 0552 10/24/16 0220 10/25/16 0532 10/26/16 0450  WBC 22.3* 17.6* 15.0* 12.9* 14.4*  HGB 13.2 11.8* 11.5* 12.4* 13.5  HCT 38.6* 33.6* 33.7* 36.7* 38.6*  MCV 101.0* 98.2 99.1 99.2 97.2  PLT 361 301 310 347 403*   Basic Metabolic Panel:  Recent Labs Lab 10/21/16 0926 10/22/16 0416 10/23/16 0552 10/24/16 0220 10/25/16 0535  NA 140 133* 131* 135 134*  K 4.1 4.3 3.5 3.8 4.5  CL 104 97* 95* 99* 98*  CO2 26 26 26 28 27   GLUCOSE 104* 193* 101* 100* 103*  BUN 10 10 8 10 13   CREATININE 0.84 0.85 0.77 0.79 0.85  CALCIUM 9.1 8.7* 8.5* 8.4* 9.1  MG  --   --  1.6* 1.8  --    GFR: Estimated Creatinine Clearance: 94 mL/min (by C-G formula based on SCr of 0.85 mg/dL). Liver Function Tests: No results for input(s): AST, ALT, ALKPHOS, BILITOT, PROT, ALBUMIN in the last 168 hours. No results for input(s): LIPASE, AMYLASE  in the last 168 hours. No results for input(s): AMMONIA in the last 168 hours. Coagulation Profile:  Recent Labs Lab 10/22/16 1118  INR 1.03   Cardiac Enzymes:  Recent Labs Lab 10/22/16 0416  TROPONINI 5.00*   BNP (last 3 results) No results for input(s): PROBNP in the last 8760 hours. HbA1C: No results for input(s): HGBA1C in the last 72 hours. CBG: No results for input(s): GLUCAP in the last 168 hours. Lipid Profile: No results for input(s): CHOL, HDL, LDLCALC, TRIG, CHOLHDL, LDLDIRECT in the last 72 hours. Thyroid Function Tests: No results for input(s): TSH, T4TOTAL, FREET4, T3FREE, THYROIDAB in the last 72 hours. Anemia Panel: No results for input(s): VITAMINB12, FOLATE, FERRITIN, TIBC, IRON, RETICCTPCT in the last 72 hours. Urine analysis: No results found  for: COLORURINE, APPEARANCEUR, LABSPEC, PHURINE, GLUCOSEU, HGBUR, BILIRUBINUR, KETONESUR, PROTEINUR, UROBILINOGEN, NITRITE, LEUKOCYTESUR Sepsis Labs: @LABRCNTIP (procalcitonin:4,lacticidven:4)  ) Recent Results (from the past 240 hour(s))  Surgical pcr screen     Status: None   Collection Time: 10/21/16 10:06 AM  Result Value Ref Range Status   MRSA, PCR NEGATIVE NEGATIVE Final   Staphylococcus aureus NEGATIVE NEGATIVE Final    Comment: (NOTE) The Xpert SA Assay (FDA approved for NASAL specimens in patients 41 years of age and older), is one component of a comprehensive surveillance program. It is not intended to diagnose infection nor to guide or monitor treatment.   MRSA PCR Screening     Status: None   Collection Time: 10/22/16  4:23 AM  Result Value Ref Range Status   MRSA by PCR NEGATIVE NEGATIVE Final    Comment:        The GeneXpert MRSA Assay (FDA approved for NASAL specimens only), is one component of a comprehensive MRSA colonization surveillance program. It is not intended to diagnose MRSA infection nor to guide or monitor treatment for MRSA infections.       Studies: No results found.  Scheduled Meds: . aspirin EC  81 mg Oral Daily  . atorvastatin  80 mg Oral q1800  . docusate sodium  100 mg Oral BID  . enoxaparin (LOVENOX) injection  40 mg Subcutaneous Q24H  . furosemide  20 mg Oral Daily  . lisinopril  2.5 mg Oral Daily  . mouth rinse  15 mL Mouth Rinse BID  . metoprolol succinate  25 mg Oral Daily  . potassium chloride  20 mEq Oral BID  . sertraline  25 mg Oral QHS  . sodium chloride flush  3 mL Intravenous Q12H  . spironolactone  12.5 mg Oral Daily  . terbinafine   Topical Daily    Continuous Infusions: . sodium chloride    . 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  Chronic depression -Continue Zoloft  Tobacco abuse -Patient advised to quit smoking -Tobacco counseling     Code Status: Full code  Family Communication: None  Disposition Plan: Will stay another midnight to continue current treatment until he obtains lifevest prior to discharge.   Consultants:  Cardiology orthopedic surgery  Procedures:  Intramedullary rod placement for tibia fibula fracture done on 10/21/2016  Antimicrobials:  None indicated  DVT prophylaxis: Lovenox 40 mg subcu daily    Darlin Droparole N Jeanny Rymer, MD [facility] Pager [phone]  If 7PM-7AM, please contact night-coverage [email or web address] Password [email or web address] 10/26/2016, 8:18 AM

## 2016-10-26 NOTE — Progress Notes (Signed)
Progress Note  Patient Name: Eduardo [name] Date of Encounter: 10/26/2016  Primary Cardiologist: New  Patient Profile     51 y.o. male with CAD/prior MI/prior stenting of the Circumflex in 2008, tobacco abuse admitted after surgery for left tib/fib fracture. Pt with chest pain post op and was seen by Dr. Excell Seltzer. No EKG changes so planned for outpatient stress test. He had increased SOB 10/16 around 4 am and reported pulmonary edema. Troponin elevated at 5.0. Underwent cardiac cath showing 3v disease with EF of 20%.  CABG recommended " after he improves"   LifeVEst recommended   Subjective   No chest pain no sob Some nausea  Struggling with BM LifeVest scheduled for tomorrow  Inpatient Medications    Scheduled Meds: . aspirin EC  81 mg Oral Daily  . atorvastatin  80 mg Oral q1800  . docusate sodium  100 mg Oral BID  . enoxaparin (LOVENOX) injection  40 mg Subcutaneous Q24H  . furosemide  20 mg Oral Daily  . lisinopril  2.5 mg Oral Daily  . mouth rinse  15 mL Mouth Rinse BID  . metoprolol succinate  25 mg Oral Daily  . potassium chloride  20 mEq Oral BID  . sertraline  25 mg Oral QHS  . sodium chloride flush  3 mL Intravenous Q12H  . spironolactone  12.5 mg Oral Daily  . terbinafine   Topical Daily   Continuous Infusions: . sodium chloride    . methocarbamol (ROBAXIN)  IV     PRN Meds: sodium chloride, [DISCONTINUED] acetaminophen **OR** acetaminophen, acetaminophen, bisacodyl, HYDROmorphone (DILAUDID) injection, methocarbamol **OR** methocarbamol (ROBAXIN)  IV, ondansetron **OR** ondansetron (ZOFRAN) IV, oxyCODONE, polyethylene glycol, sodium chloride flush, zolpidem   Vital Signs    Vitals:   10/26/16 0020 10/26/16 0500 10/26/16 0742 10/26/16 0940  BP: 98/65 108/73 (!) 84/63 108/77  Pulse: 93 94 62   Resp: 18 18 18    Temp: 99.1 F (37.3 C) 97.6 F (36.4 C) 97.6 F (36.4 C)   TempSrc: Oral Oral Oral   SpO2: 99% 94% 95%   Weight:  156 lb 6.4 oz (70.9 kg)      Height:        Intake/Output Summary (Last 24 hours) at 10/26/16 1146 Last data filed at 10/26/16 0929  Gross per 24 hour  Intake             1163 ml  Output             1350 ml  Net             -187 ml   Filed Weights   10/24/16 0500 10/25/16 0500 10/26/16 0500  Weight: 164 lb 0.4 oz (74.4 kg) 160 lb 11.2 oz (72.9 kg) 156 lb 6.4 oz (70.9 kg)    Telemetry    NSR without signif arrhythmia - Personally Reviewed   Physical Exam  Well developed and nourished in no acute distress HENT normal Neck supple with JVP-flat Carotids brisk and full without bruits Clear Regular rate and rhythm, no murmurs or gallops Abd-soft with active BS without hepatomegaly No Clubbing cyanosis edema Skin-warm and dry A & Oriented  Grossly normal sensory and motor function    Labs    Chemistry  Recent Labs Lab 10/23/16 0552 10/24/16 0220 10/25/16 0535  NA 131* 135 134*  K 3.5 3.8 4.5  CL 95* 99* 98*  CO2 26 28 27   GLUCOSE 101* 100* 103*  BUN 8 10 13   CREATININE 0.77  0.79 0.85  CALCIUM 8.5* 8.4* 9.1  GFRNONAA >60 >60 >60  GFRAA >60 >60 >60  ANIONGAP 10 8 9      Hematology  Recent Labs Lab 10/24/16 0220 10/25/16 0532 10/26/16 0450  WBC 15.0* 12.9* 14.4*  RBC 3.40* 3.70* 3.97*  HGB 11.5* 12.4* 13.5  HCT 33.7* 36.7* 38.6*  MCV 99.1 99.2 97.2  MCH 33.8 33.5 34.0  MCHC 34.1 33.8 35.0  RDW 11.7 11.8 11.6  PLT 310 347 403*    Cardiac Enzymes  Recent Labs Lab 10/22/16 0416  TROPONINI 5.00*   No results for input(s): TROPIPOC in the last 168 hours.   BNP  Recent Labs Lab 10/22/16 0417  BNP 233.5*     DDimer   Recent Labs Lab 10/22/16 0416  DDIMER 11.13*     Radiology    No results found.  Cardiac Studies   Echo 10-22-2016: Left ventricle:  The cavity size was mildly dilated. Wall thickness was normal. The estimated ejection fraction was 20%. Diffuse hypokinesis. Features are consistent with a pseudonormal left ventricular filling pattern, with  concomitant abnormal relaxation and increased filling pressure (grade 2 diastolic dysfunction).  ------------------------------------------------------------------- Aortic valve:   Trileaflet.  Doppler:   There was no stenosis. There was no regurgitation.  ------------------------------------------------------------------- Aorta:  Aortic root: The aortic root was normal in size. Ascending aorta: The ascending aorta was normal in size.  ------------------------------------------------------------------- Mitral valve:   Normal thickness leaflets .  Doppler:   There was no evidence for stenosis.   There was mild regurgitation.    Valve area by pressure half-time: 7.33 cm^2. Indexed valve area by pressure half-time: 3.8 cm^2/m^2.    Peak gradient (D): 3 mm Hg.   ------------------------------------------------------------------- Left atrium:  The atrium was normal in size.  ------------------------------------------------------------------- Right ventricle:  The cavity size was normal. Systolic function was mildly reduced.  ------------------------------------------------------------------- Pulmonic valve:    Structurally normal valve.   Cusp separation was normal.  Doppler:  Transvalvular velocity was within the normal range. There was no regurgitation.  ------------------------------------------------------------------- Tricuspid valve:   Doppler:  There was no significant regurgitation.  ------------------------------------------------------------------- Pulmonary artery:   No complete TR doppler jet so unable to estimate PA systolic pressure.  ------------------------------------------------------------------- Right atrium:  The atrium was normal in size.  ------------------------------------------------------------------- Pericardium:  There was no pericardial effusion.  ------------------------------------------------------------------- Systemic  veins: Inferior vena cava: The vessel was normal in size. The respirophasic diameter changes were in the normal range (>= 50%), consistent with normal central venous pressure.  Cardiac Cath 10-22-2016: Conclusion    Acute on chronic systolic heart failure with LVEDP 35 mmHg. Leftventriculography was not performed but echo done earlier today demonstrated EF less than 20%.  Severe three-vessel coronary disease with total occlusion of the proximal circumflex and the previously stented segment. Faint left to left collaterals are noted.  Severe diffuse disease in the proximal and mid LAD.  Severe mid RCA stenosis.  RECOMMENDATIONS:   Aggressive management of decompensated heart failure. Limit fluid administration.  IV Lasix 40 mg given in the cath lab.  Guideline directed therapy for systolic heart failure.  After heart failure is better controlled, consider revascularization options.   Discussed cessation of alcohol intake with the patient.      Assessment & Plan    1. NSTEMI: CHF acute systolic HTN L tib/fib Fracure Hyperlipidemia   Completed his outpatient CABG. He wants to wait until his leg is healed. It is anticipated he will go home with Life Vest tomorrow. I  checked with Dr. Erlinda [facility]  Re: Plavix therapy; we will initiate Plavix at this time.  Continue high-dose statins.  Is on beta blockers Aldactone and ACE inhibitor. Up titration limited by blood pressure  Check TSH  For questions or updates, please contact [facility] Please consult [email or web address] for contact info under Cardiology/STEMI.     Signed, Sherryl MangesSteven Jasmain Ahlberg, MD  10/26/2016, 11:46 AM

## 2016-10-27 DIAGNOSIS — I25110 Atherosclerotic heart disease of native coronary artery with unstable angina pectoris: Secondary | ICD-10-CM

## 2016-10-27 LAB — CBC WITH DIFFERENTIAL/PLATELET
BASOS ABS: 0.1 10*3/uL (ref 0.0–0.1)
BASOS PCT: 0 %
EOS PCT: 1 %
Eosinophils Absolute: 0.2 10*3/uL (ref 0.0–0.7)
HCT: 38.1 % — ABNORMAL LOW (ref 39.0–52.0)
Hemoglobin: 13.2 g/dL (ref 13.0–17.0)
LYMPHS PCT: 20 %
Lymphs Abs: 3 10*3/uL (ref 0.7–4.0)
MCH: 33.8 pg (ref 26.0–34.0)
MCHC: 34.6 g/dL (ref 30.0–36.0)
MCV: 97.7 fL (ref 78.0–100.0)
Monocytes Absolute: 1.8 10*3/uL — ABNORMAL HIGH (ref 0.1–1.0)
Monocytes Relative: 12 %
Neutro Abs: 10.1 10*3/uL — ABNORMAL HIGH (ref 1.7–7.7)
Neutrophils Relative %: 67 %
PLATELETS: 426 10*3/uL — AB (ref 150–400)
RBC: 3.9 MIL/uL — AB (ref 4.22–5.81)
RDW: 11.7 % (ref 11.5–15.5)
WBC: 15.2 10*3/uL — AB (ref 4.0–10.5)

## 2016-10-27 LAB — BASIC METABOLIC PANEL
ANION GAP: 9 (ref 5–15)
BUN: 21 mg/dL — ABNORMAL HIGH (ref 6–20)
CO2: 24 mmol/L (ref 22–32)
Calcium: 8.9 mg/dL (ref 8.9–10.3)
Chloride: 98 mmol/L — ABNORMAL LOW (ref 101–111)
Creatinine, Ser: 1.21 mg/dL (ref 0.61–1.24)
GLUCOSE: 106 mg/dL — AB (ref 65–99)
POTASSIUM: 4.8 mmol/L (ref 3.5–5.1)
Sodium: 131 mmol/L — ABNORMAL LOW (ref 135–145)

## 2016-10-27 MED ORDER — SPIRONOLACTONE 25 MG PO TABS: 12.5000 mg | ORAL_TABLET | Freq: Every day | ORAL | 0 refills | Status: DC

## 2016-10-27 MED ORDER — HYDROMORPHONE HCL 1 MG/ML IJ SOLN: 0.5000 mg | INTRAMUSCULAR | 0 refills | Status: DC | PRN

## 2016-10-27 MED ORDER — ZOLPIDEM TARTRATE 5 MG PO TABS: 5.0000 mg | ORAL_TABLET | Freq: Every evening | ORAL | 0 refills | Status: DC | PRN

## 2016-10-27 MED ORDER — BISACODYL 5 MG PO TBEC: 5.0000 mg | DELAYED_RELEASE_TABLET | Freq: Every day | ORAL | 0 refills | Status: DC | PRN

## 2016-10-27 MED ORDER — ASPIRIN 81 MG PO TBEC: 81.0000 mg | DELAYED_RELEASE_TABLET | Freq: Every day | ORAL | 0 refills | Status: DC

## 2016-10-27 MED ORDER — LISINOPRIL 2.5 MG PO TABS: 2.5000 mg | ORAL_TABLET | Freq: Every day | ORAL | 0 refills | Status: DC

## 2016-10-27 MED ORDER — METOPROLOL SUCCINATE ER 25 MG PO TB24: 25.0000 mg | ORAL_TABLET | Freq: Every day | ORAL | 0 refills | Status: DC

## 2016-10-27 MED ORDER — ATORVASTATIN CALCIUM 80 MG PO TABS: 80.0000 mg | ORAL_TABLET | Freq: Every day | ORAL | 0 refills | Status: DC

## 2016-10-27 MED ORDER — FUROSEMIDE 20 MG PO TABS: 20.0000 mg | ORAL_TABLET | Freq: Every day | ORAL | 0 refills | Status: DC

## 2016-10-27 MED ORDER — CLOPIDOGREL BISULFATE 75 MG PO TABS: 75.0000 mg | ORAL_TABLET | Freq: Every day | ORAL | 0 refills | Status: DC

## 2016-10-27 MED ORDER — TERBINAFINE HCL 1 % EX CREA: TOPICAL_CREAM | Freq: Every day | CUTANEOUS | 0 refills | Status: DC

## 2016-10-27 MED ORDER — DOCUSATE SODIUM 100 MG PO CAPS: 100.0000 mg | ORAL_CAPSULE | Freq: Two times a day (BID) | ORAL | 0 refills | Status: DC

## 2016-10-27 NOTE — Progress Notes (Signed)
Progress Note  Patient Name: Eduardo [name] Date of Encounter: 10/27/2016  Primary Cardiologist: New  Patient Profile     51 y.o. male with CAD/prior MI/prior stenting of the Circumflex in 2008, tobacco abuse admitted after surgery for left tib/fib fracture. Pt with chest pain post op and was seen by Dr. Excell Seltzerooper. No EKG changes so planned for outpatient stress test. He had increased SOB 10/16 around 4 am and reported pulmonary edema. Troponin elevated at 5.0. Underwent cardiac cath showing 3v disease with EF of 20%.  CABG recommended " after he improves"   LifeVEst recommended   Subjective  Without chest pain or sob No edema  Inpatient Medications    Scheduled Meds: . aspirin EC  81 mg Oral Daily  . atorvastatin  80 mg Oral q1800  . clopidogrel  75 mg Oral Daily  . docusate sodium  100 mg Oral BID  . enoxaparin (LOVENOX) injection  40 mg Subcutaneous Q24H  . furosemide  20 mg Oral Daily  . lisinopril  2.5 mg Oral Daily  . mouth rinse  15 mL Mouth Rinse BID  . metoprolol succinate  25 mg Oral Daily  . potassium chloride  20 mEq Oral BID  . sertraline  25 mg Oral QHS  . sodium chloride flush  3 mL Intravenous Q12H  . spironolactone  12.5 mg Oral Daily  . terbinafine   Topical Daily   Continuous Infusions: . sodium chloride    . methocarbamol (ROBAXIN)  IV     PRN Meds: sodium chloride, [DISCONTINUED] acetaminophen **OR** acetaminophen, acetaminophen, bisacodyl, HYDROmorphone (DILAUDID) injection, methocarbamol **OR** methocarbamol (ROBAXIN)  IV, ondansetron **OR** ondansetron (ZOFRAN) IV, oxyCODONE, polyethylene glycol, sodium chloride flush, zolpidem   Vital Signs    Vitals:   10/26/16 2005 10/27/16 0100 10/27/16 0609 10/27/16 1006  BP: 106/77 113/73 105/73 117/72  Pulse: 88 98 86 87  Resp: 20 20 20    Temp: 98.4 F (36.9 C) 98.3 F (36.8 C) 98.4 F (36.9 C)   TempSrc: Oral Oral Oral   SpO2: 98% 98% 96%   Weight:   151 lb 4.8 oz (68.6 kg)   Height:         Intake/Output Summary (Last 24 hours) at 10/27/16 1118 Last data filed at 10/27/16 0921  Gross per 24 hour  Intake              480 ml  Output              200 ml  Net              280 ml   Filed Weights   10/25/16 0500 10/26/16 0500 10/27/16 0609  Weight: 160 lb 11.2 oz (72.9 kg) 156 lb 6.4 oz (70.9 kg) 151 lb 4.8 oz (68.6 kg)    Telemetry    NSR without signif arrhythmia - Personally Reviewed   Physical Exam  Well developed and nourished in no acute distress HENT normal Neck supple with JVP-flat Carotids brisk and full without bruits Clear Regular rate and rhythm, no murmurs or gallops Abd-soft with active BS without hepatomegaly No Clubbing cyanosis edema Skin-warm and dry A & Oriented  Grossly normal sensory and motor function   Labs    Chemistry  Recent Labs Lab 10/24/16 0220 10/25/16 0535 10/27/16 0713  NA 135 134* 131*  K 3.8 4.5 4.8  CL 99* 98* 98*  CO2 28 27 24   GLUCOSE 100* 103* 106*  BUN 10 13 21*  CREATININE 0.79 0.85  1.21  CALCIUM 8.4* 9.1 8.9  GFRNONAA >60 >60 >60  GFRAA >60 >60 >60  ANIONGAP 8 9 9      Hematology  Recent Labs Lab 10/25/16 0532 10/26/16 0450 10/27/16 0713  WBC 12.9* 14.4* 15.2*  RBC 3.70* 3.97* 3.90*  HGB 12.4* 13.5 13.2  HCT 36.7* 38.6* 38.1*  MCV 99.2 97.2 97.7  MCH 33.5 34.0 33.8  MCHC 33.8 35.0 34.6  RDW 11.8 11.6 11.7  PLT 347 403* 426*    Cardiac Enzymes  Recent Labs Lab 10/22/16 0416  TROPONINI 5.00*   No results for input(s): TROPIPOC in the last 168 hours.   BNP  Recent Labs Lab 10/22/16 0417  BNP 233.5*   TSH normlla DDimer   Recent Labs Lab 10/22/16 0416  DDIMER 11.13*     Radiology    No results found.  Cardiac Studies   Echo 10-22-2016: Left ventricle:  The cavity size was mildly dilated. Wall thickness was normal. The estimated ejection fraction was 20%. Diffuse hypokinesis. Features are consistent with a pseudonormal left ventricular filling pattern, with  concomitant abnormal relaxation and increased filling pressure (grade 2 diastolic dysfunction).  ------------------------------------------------------------------- Aortic valve:   Trileaflet.  Doppler:   There was no stenosis. There was no regurgitation.  ------------------------------------------------------------------- Aorta:  Aortic root: The aortic root was normal in size. Ascending aorta: The ascending aorta was normal in size.  ------------------------------------------------------------------- Mitral valve:   Normal thickness leaflets .  Doppler:   There was no evidence for stenosis.   There was mild regurgitation.    Valve area by pressure half-time: 7.33 cm^2. Indexed valve area by pressure half-time: 3.8 cm^2/m^2.    Peak gradient (D): 3 mm Hg.   ------------------------------------------------------------------- Left atrium:  The atrium was normal in size.  ------------------------------------------------------------------- Right ventricle:  The cavity size was normal. Systolic function was mildly reduced.  ------------------------------------------------------------------- Pulmonic valve:    Structurally normal valve.   Cusp separation was normal.  Doppler:  Transvalvular velocity was within the normal range. There was no regurgitation.  ------------------------------------------------------------------- Tricuspid valve:   Doppler:  There was no significant regurgitation.  ------------------------------------------------------------------- Pulmonary artery:   No complete TR doppler jet so unable to estimate PA systolic pressure.  ------------------------------------------------------------------- Right atrium:  The atrium was normal in size.  ------------------------------------------------------------------- Pericardium:  There was no pericardial effusion.  ------------------------------------------------------------------- Systemic  veins: Inferior vena cava: The vessel was normal in size. The respirophasic diameter changes were in the normal range (>= 50%), consistent with normal central venous pressure.  Cardiac Cath 10-22-2016: Conclusion    Acute on chronic systolic heart failure with LVEDP 35 mmHg. Leftventriculography was not performed but echo done earlier today demonstrated EF less than 20%.  Severe three-vessel coronary disease with total occlusion of the proximal circumflex and the previously stented segment. Faint left to left collaterals are noted.  Severe diffuse disease in the proximal and mid LAD.  Severe mid RCA stenosis.  RECOMMENDATIONS:   Aggressive management of decompensated heart failure. Limit fluid administration.  IV Lasix 40 mg given in the cath lab.  Guideline directed therapy for systolic heart failure.  After heart failure is better controlled, consider revascularization options.   Discussed cessation of alcohol intake with the patient.      Assessment & Plan    NSTEMI: Ischemic cardiomyopathy CHF acute systolic HTN L tib/fib Fracure Hyperlipidemia     anticipates outpatient CABG. He wants to wait until his leg is healed.   DAPT  Continue high-dose statins.  Is on beta  blockers Aldactone and ACE inhibitor. Up titration limited by blood pressure  Check TSH    Sherryl Manges, MD  10/27/2016, 11:18 AM

## 2016-10-27 NOTE — Progress Notes (Addendum)
[facility] [address] [facility]-up [facility], and our office will call the patient with this information.  Cath site instructions left on AVS in Discharge Instructions. Patient should not drive from cardiac perspective until cleared by cardiology since he will be wearing LifeVest. Also left instructions for no strenuous activity or lifting over 10lb until cleared by cardiology.  Dr. Graciela HusbandsKlein has seen patient and feels he is OK to go after LifeVest fitting this afternoon. (Per Care Mgmt note everything arranged from the orders standpoint.)  Ronie Spiesayna Farah Benish PA-C

## 2016-10-27 NOTE — Progress Notes (Signed)
Representative from [employer] here and taught patient and his significant other care and management of Life Vest.  Patient stated understanding.  CCMD notified Life Vest is fitted.

## 2016-10-27 NOTE — Discharge Instructions (Signed)
Cast or Splint Care, Adult Casts and splints are supports that are worn to protect broken bones and other injuries. A cast or splint may hold a bone still and in the correct position while it heals. Casts and splints may also help to ease pain, swelling, and muscle spasms. How to care for your cast  Do not stick anything inside the cast to scratch your skin.  Check the skin around the cast every day. Tell your doctor about any concerns.  You may put lotion on dry skin around the edges of the cast. Do not put lotion on the skin under the cast.  Keep the cast clean.  If the cast is not waterproof: ? Do not let it get wet. ? Cover it with a watertight covering when you take a bath or a shower. How to care for your splint  Wear it as told by your doctor. Take it off only as told by your doctor.  Loosen the splint if your fingers or toes tingle, get numb, or turn cold and blue.  Keep the splint clean.  If the splint is not waterproof: ? Do not let it get wet. ? Cover it with a watertight covering when you take a bath or a shower. Follow these instructions at home: Bathing  Do not take baths or swim until your doctor says it is okay. Ask your doctor if you can take showers. You may only be allowed to take sponge baths for bathing.  If your cast or splint is not waterproof, cover it with a watertight covering when you take a bath or shower. Managing pain, stiffness, and swelling  Move your fingers or toes often to avoid stiffness and to lessen swelling.  Raise (elevate) the injured area above the level of your heart while sitting or lying down. Safety  Do not use the injured limb to support your body weight until your doctor says that it is okay.  Use crutches or other assistive devices as told by your doctor. General instructions  Do not put pressure on any part of the cast or splint until it is fully hardened. This may take many hours.  Return to your normal activities as  told by your doctor. Ask your doctor what activities are safe for you.  Keep all follow-up visits as told by your doctor. This is important. Contact a doctor if:  Your cast or splint gets damaged.  The skin around the cast gets red or raw.  The skin under the cast is very itchy or painful.  Your cast or splint feels very uncomfortable.  Your cast or splint is too tight or too loose.  Your cast becomes wet or it starts to have a soft spot or area.  You get an object stuck under your cast. Get help right away if:  Your pain gets worse.  The injured area tingles, gets numb, or turns blue and cold.  The part of your body above or below the cast is swollen and it turns a different color (is discolored).  You cannot feel or move your fingers or toes.  There is fluid leaking through the cast.  You have very bad pain or pressure under the cast.  You have trouble breathing.  You have shortness of breath.  You have chest pain. This information is not intended to replace advice given to you by your health care provider. Make sure you discuss any questions you have with your health care provider. Document Released: 04/25/2010  Document Revised: 12/15/2015 Document Reviewed: 12/15/2015 Elsevier Interactive Patient Education  2017 [employer]  Intramedullary Nailing of Tibial Diaphyseal Fracture Intramedullary nailing is a surgical procedure to treat a broken bone. In this procedure, a metal rod or wire (often referred to as a nail) is placed inside the tibia. The tibia is one of the two long bones of the lower leg. The nail holds the broken bone in place to heal. With nailing, you will not need a cast, and you may be able to put weight on your leg right away. Tell a health care provider about:  Any allergies you have.  All medicines you are taking, including vitamins, herbs, eye drops, creams, and over-the-counter medicines.  Any problems you or family members have had with  anesthetic medicines.  Any blood disorders you have.  Any surgeries you have had.  Any medical conditions you have. What are the risks? Generally, this is a safe procedure. However, as with any procedure, problems can occur. Possible problems include:  Allergic reaction to the anesthetic medicine.  Damage to surrounding nerves, tissues, or structures.  Infection.  Blood clot.  Bleeding.  Scarring.  Knee pain.  Additional procedure to remove intramedullary nail.  Incorrect healing of the bone (nonunion).  Severe swelling and pain in your injured leg (compartment syndrome).  What happens before the procedure?  You may have a physical exam, blood and urine tests, and imaging tests such as an X-ray or MRI.  Ask your health care provider about changing or stopping your regular medicines. This is especially important if you are taking diabetes medicines or blood thinners.  Do not eat or drink after midnight the night before the procedure. This will help you to avoid problems from the anesthetic used.  Do not use any tobacco products, including cigarettes, chewing tobacco, or electronic cigarettes.  Arrange for someone to take you home after the procedure and help you with activities at home during your recovery. What happens during the procedure? This procedure may include the following steps:  An IV tube will be inserted into a vein in your body. Medicines and fluids will be given to you through this tube.  You will receive medicine to make you go to sleep (general anesthetic).  You will be connected to monitors to watch your blood pressure, heart rate, and oxygen level.  The surgeon will make a small cut (incision) just under your knee.  A metal rod is placed into the canal of your injured tibia and screwed into place near the knee and ankle joint.  Incisions are closed with stitches.  What happens after the procedure?  You will most likely be in the hospital for  1-2 days following surgery.  Pain and swelling will be managed with medicines and supportive care.  The incision will be covered with bandages, but you will not have a cast.  Follow your health care provider's instructions for home care after surgery, including activity limitations, incision care, bathing, and physical therapy. This information is not intended to replace advice given to you by your health care provider. Make sure you discuss any questions you have with your health care provider. Document Released: 12/29/2012 Document Revised: 06/01/2015 Document Reviewed: 11/25/2012 Elsevier Interactive Patient Education  2017 [employer] POST CATH: No driving, lifting over 10 lbs, sexual activity or strenuous activity until cleared by your cardiologist. You may not return to work until cleared by your cardiologist. Keep cardiac cath procedure site clean & dry. If you notice increased  pain, swelling, bleeding or pus, call/return!  You may shower, but no soaking baths/hot tubs/pools for 1 week.

## 2016-10-27 NOTE — Discharge Summary (Signed)
Discharge Summary  Eduardo [name] ZOX:[id number] DOB: 1965-05-22  PCP: Patient, No Pcp Per  Admit date: 10/21/2016 Discharge date: 10/27/2016  Time spent: 25 minutes  Recommendations for Outpatient Follow-up:  1. Follow-up with cardiologist within a week 2. Continue to wear LifeVest until otherwise recommended by cardiology 3. Follow-up with your primary care provider within a week 4. Take your medications as prescribed  5. Tobacco and alcohol use cessation  Discharge Diagnoses:  Active Hospital Problems   Diagnosis Date Noted  . Displaced comminuted fracture of shaft of left tibia, initial encounter for closed fracture 10/21/2016  . Surgery, elective   . Acute on chronic combined systolic and diastolic CHF (congestive heart failure) (HCC)   . Acute pulmonary edema (HCC)   . Elevated troponin   . Coronary artery disease involving native coronary artery   . Acute respiratory failure with hypoxia (HCC) 10/22/2016  . Flash pulmonary edema (HCC) 10/22/2016  . Non-ST elevation (NSTEMI) myocardial infarction (HCC) 10/22/2016  . Coronary artery disease involving native coronary artery of native heart without angina pectoris   . Left tibial fracture 10/21/2016    Resolved Hospital Problems   Diagnosis Date Noted Date Resolved  No resolved problems to display.    Discharge Condition: Stable  Diet recommendation: Cardiac healthy diet  Vitals:   10/27/16 1006 10/27/16 1148  BP: 117/72 106/71  Pulse: 87 83  Resp:  20  Temp:  98.1 F (36.7 C)  SpO2:  95%    History of present illness:  Eduardo [name] is a 51 year old Caucasian male with past medical history significant for Coronary artery disease status post stenting, tobacco and alcohol use disorder who presented to [facility] on 10/21/2016 after a fall that resulted in left tibial fibula fracture. Patient was admitted by the orthopedic service and is POD #6 post intramedullary rod Tibia fibula fracture. On 10/22/2016 patient  had an NSTEMI and subsequently had a a left heart catheterization. Transthoracic echocardiogram done on 10/22/2016 revealed EF of 20%. Promptly plans were made for a LifeVest per cardiology request.   The rest of the patient's stay was grossly uneventful. On the day of discharge, patient received his lifevest.  He is hemodynamically stable.  Tobacco counseling was done at that bedside.  Patient was advised to take his medications as prescribed and follow-up with his cardiologist and PCP.  Hospital Course:  Principal Problem:   Displaced comminuted fracture of shaft of left tibia, initial encounter for closed fracture Active Problems:   Left tibial fracture   Acute respiratory failure with hypoxia (HCC)   Flash pulmonary edema (HCC)   Non-ST elevation (NSTEMI) myocardial infarction [facility])   Coronary artery disease involving native coronary artery of native heart without angina pectoris   Acute on chronic combined systolic and diastolic CHF (congestive heart failure) (HCC)   Acute pulmonary edema (HCC)   Elevated troponin   Coronary artery disease involving native coronary artery   Surgery, elective   Procedures:  Left cardiac heart catheterization  Transthoracic echocardiogram with EF of 20%  Consultations:  Cardiology and orthopedic surgery  Discharge Exam: BP 106/71 (BP Location: Left Arm)   Pulse 83   Temp 98.1 F (36.7 C) (Oral)   Resp 20   Ht 5\' 7"  (1.702 m)   Wt 68.6 kg (151 lb 4.8 oz) Comment: scale b  SpO2 95%   BMI 23.70 kg/m   General: 51 year old Caucasian male, well developed well nourished lying in bed, breathing on room air in no apparent acute distress.  Cardiovascular: Regular rate and rhythm with no murmurs, rubs, or gallops Respiratory: Clear to auscultation with no wheezing or rhonchi  Discharge Instructions You were cared for by a hospitalist during your hospital stay. If you have any questions about your discharge medications or the care you received  while you were in the hospital after you are discharged, you can call the unit and asked to speak with the hospitalist on call if the hospitalist that took care of you is not available. Once you are discharged, your primary care physician will handle any further medical issues. Please note that NO REFILLS for any discharge medications will be authorized once you are discharged, as it is imperative that you return to your primary care physician (or establish a relationship with a primary care physician if you do not have one) for your aftercare needs so that they can reassess your need for medications and monitor your lab values.   Allergies as of 10/27/2016      Reactions   Penicillins Itching      Medication List    STOP taking these medications   HYDROcodone-acetaminophen 5-325 MG tablet Commonly known as:  NORCO/VICODIN     TAKE these medications   aspirin EC 81 MG tablet Take 81 mg by mouth daily. What changed:  Another medication with the same name was added. Make sure you understand how and when to take each.   aspirin 81 MG EC tablet Take 1 tablet (81 mg total) by mouth daily. What changed:  You were already taking a medication with the same name, and this prescription was added. Make sure you understand how and when to take each.   atorvastatin 80 MG tablet Commonly known as:  LIPITOR Take 1 tablet (80 mg total) by mouth daily at 6 PM.   bisacodyl 5 MG EC tablet Commonly known as:  DULCOLAX Take 1 tablet (5 mg total) by mouth daily as needed for moderate constipation.   clopidogrel 75 MG tablet Commonly known as:  PLAVIX Take 1 tablet (75 mg total) by mouth daily.   docusate sodium 100 MG capsule Commonly known as:  COLACE Take 1 capsule (100 mg total) by mouth 2 (two) times daily.   FISH OIL PO Take by mouth.   furosemide 20 MG tablet Commonly known as:  LASIX Take 1 tablet (20 mg total) by mouth daily.   HYDROmorphone 1 MG/ML injection Commonly known as:   DILAUDID Inject 0.5-1 mLs (0.5-1 mg total) into the vein every 3 (three) hours as needed for severe pain (unresolved breakthrough pain).   lisinopril 2.5 MG tablet Commonly known as:  PRINIVIL,ZESTRIL Take 1 tablet (2.5 mg total) by mouth daily.   metoprolol succinate 25 MG 24 hr tablet Commonly known as:  TOPROL-XL Take 1 tablet (25 mg total) by mouth daily.   oxyCODONE-acetaminophen 5-325 MG tablet Commonly known as:  PERCOCET/ROXICET Take 1-2 tablets by mouth every 4 (four) hours as needed for severe pain.   spironolactone 25 MG tablet Commonly known as:  ALDACTONE Take 0.5 tablets (12.5 mg total) by mouth daily.   terbinafine 1 % cream Commonly known as:  LAMISIL Apply topically daily.   tiZANidine 2 MG tablet Commonly known as:  ZANAFLEX Take 1 tablet (2 mg total) by mouth every 8 (eight) hours as needed for muscle spasms.   zolpidem 5 MG tablet Commonly known as:  AMBIEN Take 1 tablet (5 mg total) by mouth at bedtime as needed and may repeat dose one time if needed  for sleep.            Durable Medical Equipment        Start     Ordered   10/25/16 1025  For home use only DME Vest life vest  Once     10/25/16 1024     Allergies  Allergen Reactions  . Penicillins Itching   Follow-up Information    Jodi Geralds, MD. Schedule an appointment as soon as possible for a visit on 11/05/2016.   Specialty:  Orthopedic Surgery Contact information: [address] [place] [address] [phone]        Tonny Bollman, MD Follow up on 11/08/2016.   Specialty:  Cardiology Why:  The office will call you to schedule your follow-up. Originally this was scheduled for 11/2 but we would like you to be seen within 7-10 days of discharge so this is a little too far out. Call the office if you haven't heard back within 2 days. Contact information: [address] [place] [address] [phone]            The results of significant  diagnostics from this hospitalization (including imaging, microbiology, ancillary and laboratory) are listed below for reference.    Significant Diagnostic Studies: Dg Tibia/fibula Left  Result Date: 10/21/2016 CLINICAL DATA:  LEFT tibial fracture, surgery EXAM: LEFT TIBIA AND FIBULA - 2 VIEW; DG C-ARM 61-120 MIN COMPARISON:  10/12/2016 FLUOROSCOPY TIME:  2 minutes 29 seconds Images obtained: 4 FINDINGS: Diffuse osseous demineralization. IM nail with proximal and distal locking screws has been placed across an oblique distal LEFT tibial diaphyseal fracture. Knee and ankle joint alignments normal. Previously identified oblique proximal and distal LEFT fibular diaphyseal fractures are less well visualized on submitted images. IMPRESSION: Post nailing of a reduced distal LEFT tibial diaphyseal fracture. Oblique fractures of the proximal and distal LEFT fibular diaphysis. Electronically Signed   By: Ulyses Southward M.D.   On: 10/21/2016 15:31   Dg Tibia/fibula Left  Result Date: 10/12/2016 CLINICAL DATA:  Obvious deformity of the ankle after a fall. EXAM: LEFT TIBIA AND FIBULA - 2 VIEW COMPARISON:  None. FINDINGS: There is an oblique mildly comminuted fracture of the proximal left fibular shaft. Mild anterior displacement and overriding of the distal fracture fragment. Spiral oblique fracture of the distal left tibial shaft with mild lateral displacement and overriding of the distal fracture fragment. No dislocation or displacement of the left knee joint as visualized. Rotation on the lateral view limits evaluation of the knee. Soft tissue swelling. IMPRESSION: 1. Oblique mildly comminuted fracture of the proximal left fibular shaft. 2. Spiral oblique fracture of the distal left tibial shaft. Electronically Signed   By: Burman Nieves M.D.   On: 10/12/2016 00:45   Dg Ankle Complete Left  Result Date: 10/12/2016 CLINICAL DATA:  Obvious deformity of the left lower leg after fall. EXAM: LEFT ANKLE COMPLETE - 3+  VIEW COMPARISON:  None. FINDINGS: Spiral oblique fracture of the distal left tibial shaft with lateral displacement and overriding of the distal fracture fragment. Old appearing fracture deformity of the distal left fibular shaft. Old ununited ossicle inferior to the medial malleolus. Degenerative changes in the ankle joint. No acute dislocation. Deformity of the talar dome likely due to degenerative changes or old injury. No acute cortical irregularity of the talus. IMPRESSION: 1. Spiral oblique fracture of the distal left tibial shaft. 2. Old fracture deformity of the distal left fibula. 3. Degenerative changes in the ankle joint. Electronically Signed  By: Burman Nieves M.D.   On: 10/12/2016 00:47   Ct Angio Chest Pe W Or Wo Contrast  Result Date: 10/22/2016 CLINICAL DATA:  Shortness of breath. Status post ORIF left lower leg. EXAM: CT ANGIOGRAPHY CHEST WITH CONTRAST TECHNIQUE: Multidetector CT imaging of the chest was performed using the standard protocol during bolus administration of intravenous contrast. Multiplanar CT image reconstructions and MIPs were obtained to evaluate the vascular anatomy. CONTRAST:  100 mL Isovue 370 COMPARISON:  None. FINDINGS: Cardiovascular: Satisfactory opacification of the pulmonary arteries to the segmental level. No evidence of pulmonary embolism. Normal heart size. No pericardial effusion. Thoracic aorta is normal in caliber. Mediastinum/Nodes: No enlarged mediastinal, hilar, or axillary lymph nodes. Thyroid gland, trachea, and esophagus demonstrate no significant findings. Lungs/Pleura: Small bilateral pleural effusions. Bibasilar atelectasis. Bilateral perihilar patchy airspace disease. More consolidated airspace disease in the posterior segment of the right upper lobe. Upper Abdomen: No acute upper abdominal abnormality. Musculoskeletal: No acute osseous abnormality. No lytic or sclerotic osseous lesion. Review of the MIP images confirms the above findings.  IMPRESSION: 1. No evidence of pulmonary embolus. 2. Pulmonary edema. 3. More consolidated airspace disease in the posterior segment of the right upper lobe concerning for superimposed pneumonia. Electronically Signed   By: Elige Ko   On: 10/22/2016 11:08   Dg Chest Port 1 View  Result Date: 10/22/2016 CLINICAL DATA:  Oxygen desaturation. EXAM: PORTABLE CHEST 1 VIEW COMPARISON:  None. FINDINGS: Cardiac enlargement with pulmonary vascular congestion. Bilateral interstitial and perihilar airspace disease, more prominent on the right. This likely represents diffuse pulmonary edema. Multifocal pneumonia could also have this appearance. No blunting of costophrenic angles. No pneumothorax. Mediastinal contours appear intact. Calcification of the aorta. Degenerative changes in the spine and shoulders. IMPRESSION: Cardiac enlargement with pulmonary vascular congestion and bilateral airspace and interstitial infiltrates, likely due to edema. Electronically Signed   By: Burman Nieves M.D.   On: 10/22/2016 04:15   Dg Knee Complete 4 Views Left  Result Date: 10/12/2016 CLINICAL DATA:  Fall EXAM: LEFT KNEE - COMPLETE 4+ VIEW COMPARISON:  Left tibia/ fibula radiographs 10/12/2016 FINDINGS: Re- demonstrated is a comminuted fracture of the proximal shaft of the left fibula. There is no fracture or dislocation at the knee. Mild anterior displacement of the left fibula is unchanged. No knee effusion. IMPRESSION: Comminuted fracture of the proximal left fibula. No acute osseous abnormality of the left knee. Electronically Signed   By: Deatra Robinson M.D.   On: 10/12/2016 02:12   Dg Foot Complete Left  Result Date: 10/12/2016 CLINICAL DATA:  Obvious deformity after a fall. EXAM: LEFT FOOT - COMPLETE 3+ VIEW COMPARISON:  None. FINDINGS: Old ununited ossicle adjacent to the navicular bone. Degenerative changes in the ankle joint. No evidence of acute fracture or dislocation involving the left foot. Soft tissues are  unremarkable. IMPRESSION: No acute bony abnormalities involving the left foot. Electronically Signed   By: Burman Nieves M.D.   On: 10/12/2016 00:47   Dg C-arm 1-60 Min  Result Date: 10/21/2016 CLINICAL DATA:  LEFT tibial fracture, surgery EXAM: LEFT TIBIA AND FIBULA - 2 VIEW; DG C-ARM 61-120 MIN COMPARISON:  10/12/2016 FLUOROSCOPY TIME:  2 minutes 29 seconds Images obtained: 4 FINDINGS: Diffuse osseous demineralization. IM nail with proximal and distal locking screws has been placed across an oblique distal LEFT tibial diaphyseal fracture. Knee and ankle joint alignments normal. Previously identified oblique proximal and distal LEFT fibular diaphyseal fractures are less well visualized on submitted images. IMPRESSION: Post  nailing of a reduced distal LEFT tibial diaphyseal fracture. Oblique fractures of the proximal and distal LEFT fibular diaphysis. Electronically Signed   By: Ulyses Southward M.D.   On: 10/21/2016 15:31    Microbiology: Recent Results (from the past 240 hour(s))  Surgical pcr screen     Status: None   Collection Time: 10/21/16 10:06 AM  Result Value Ref Range Status   MRSA, PCR NEGATIVE NEGATIVE Final   Staphylococcus aureus NEGATIVE NEGATIVE Final    Comment: (NOTE) The Xpert SA Assay (FDA approved for NASAL specimens in patients 56 years of age and older), is one component of a comprehensive surveillance program. It is not intended to diagnose infection nor to guide or monitor treatment.   MRSA PCR Screening     Status: None   Collection Time: 10/22/16  4:23 AM  Result Value Ref Range Status   MRSA by PCR NEGATIVE NEGATIVE Final    Comment:        The GeneXpert MRSA Assay (FDA approved for NASAL specimens only), is one component of a comprehensive MRSA colonization surveillance program. It is not intended to diagnose MRSA infection nor to guide or monitor treatment for MRSA infections.      Labs: Basic Metabolic Panel:  Recent Labs Lab 10/22/16 0416  10/23/16 0552 10/24/16 0220 10/25/16 0535 10/27/16 0713  NA 133* 131* 135 134* 131*  K 4.3 3.5 3.8 4.5 4.8  CL 97* 95* 99* 98* 98*  CO2 26 26 28 27 24   GLUCOSE 193* 101* 100* 103* 106*  BUN 10 8 10 13  21*  CREATININE 0.85 0.77 0.79 0.85 1.21  CALCIUM 8.7* 8.5* 8.4* 9.1 8.9  MG  --  1.6* 1.8  --   --    Liver Function Tests: No results for input(s): AST, ALT, ALKPHOS, BILITOT, PROT, ALBUMIN in the last 168 hours.  Recent Labs Lab 10/26/16 1007  LIPASE 22   No results for input(s): AMMONIA in the last 168 hours. CBC:  Recent Labs Lab 10/23/16 0552 10/24/16 0220 10/25/16 0532 10/26/16 0450 10/27/16 0713  WBC 17.6* 15.0* 12.9* 14.4* 15.2*  NEUTROABS  --   --   --   --  10.1*  HGB 11.8* 11.5* 12.4* 13.5 13.2  HCT 33.6* 33.7* 36.7* 38.6* 38.1*  MCV 98.2 99.1 99.2 97.2 97.7  PLT 301 310 347 403* 426*   Cardiac Enzymes:  Recent Labs Lab 10/22/16 0416  TROPONINI 5.00*   BNP: BNP (last 3 results)  Recent Labs  10/22/16 0417  BNP 233.5*    ProBNP (last 3 results) No results for input(s): PROBNP in the last 8760 hours.  CBG: No results for input(s): GLUCAP in the last 168 hours.     Signed:  Darlin Drop, MD [facility] 10/27/2016, 2:26 PM

## 2016-10-28 ENCOUNTER — Telehealth: Payer: Self-pay | Admitting: *Deleted

## 2016-10-28 NOTE — Telephone Encounter (Signed)
Message received in triage basket from [facility], [place] stating pt needed earlier follow up than 11/08/16.  Needs appointment 7-10 days post discharge.  Also needs TOC phone call.  Our scheduling department has contacted pt regarding appointment and he is trying to make transportation arrangements.  I placed call to home number listed for pt. There was no answer.  I tried work number listed and person answering the phone did not know pt.

## 2016-10-28 NOTE — Telephone Encounter (Signed)
I tried again to reach pt. No answer

## 2016-10-30 NOTE — Telephone Encounter (Signed)
I placed call to pt. Person who answered phone said he was sleeping. I asked her to have pt call our office later today.

## 2016-11-01 NOTE — Telephone Encounter (Addendum)
Patient [facility]  on 10/[facility].  Patient made aware that he needs to be seen within 7-10 days after discharge from the hospital. Patient states that he cannot come to be seen in the office until the appointment already scheduled on 11/2. He states that he has too much going on in his life right now and that he is doing fine and will be at the appointment on 11/2. Patient refusing to be seen sooner.   Patient understands that he needs to keep his follow up with provider Tereso NewcomerScott Weaver, PA on 11/08/16 at 9:15 AM at [address] [place], [place] [id number], since he cannot come sooner.  Patient understands discharge instructions? Yes  Patient understands medications and regiment? Yes  Patient understands to bring all medications to this visit? Yes   Patient was advised to contact our office if he has any problems before his appointment. Patient verbalized understanding and thanked me for the call.

## 2016-11-05 ENCOUNTER — Encounter (HOSPITAL_COMMUNITY): Payer: BLUE CROSS/BLUE SHIELD

## 2016-11-08 ENCOUNTER — Encounter: Payer: Self-pay | Admitting: Physician Assistant

## 2016-11-08 ENCOUNTER — Ambulatory Visit (INDEPENDENT_AMBULATORY_CARE_PROVIDER_SITE_OTHER): Payer: BLUE CROSS/BLUE SHIELD | Admitting: Physician Assistant

## 2016-11-08 VITALS — BP 94/60 | HR 84 | Ht 67.0 in

## 2016-11-08 DIAGNOSIS — E785 Hyperlipidemia, unspecified: Secondary | ICD-10-CM | POA: Insufficient documentation

## 2016-11-08 DIAGNOSIS — Z720 Tobacco use: Secondary | ICD-10-CM | POA: Diagnosis not present

## 2016-11-08 DIAGNOSIS — I5042 Chronic combined systolic (congestive) and diastolic (congestive) heart failure: Secondary | ICD-10-CM

## 2016-11-08 DIAGNOSIS — I214 Non-ST elevation (NSTEMI) myocardial infarction: Secondary | ICD-10-CM | POA: Diagnosis not present

## 2016-11-08 DIAGNOSIS — Z8781 Personal history of (healed) traumatic fracture: Secondary | ICD-10-CM

## 2016-11-08 DIAGNOSIS — I420 Dilated cardiomyopathy: Secondary | ICD-10-CM | POA: Diagnosis not present

## 2016-11-08 DIAGNOSIS — I2510 Atherosclerotic heart disease of native coronary artery without angina pectoris: Secondary | ICD-10-CM

## 2016-11-08 DIAGNOSIS — F1010 Alcohol abuse, uncomplicated: Secondary | ICD-10-CM

## 2016-11-08 LAB — BASIC METABOLIC PANEL
BUN / CREAT RATIO: 14 (ref 9–20)
BUN: 16 mg/dL (ref 6–24)
CO2: 23 mmol/L (ref 20–29)
CREATININE: 1.16 mg/dL (ref 0.76–1.27)
Calcium: 10.1 mg/dL (ref 8.7–10.2)
Chloride: 98 mmol/L (ref 96–106)
GFR calc non Af Amer: 71 mL/min/{1.73_m2} (ref >59)
GFR, EST AFRICAN AMERICAN: 83 mL/min/{1.73_m2} (ref >59)
GLUCOSE: 95 mg/dL (ref 65–99)
Potassium: 4.7 mmol/L (ref 3.5–5.2)
SODIUM: 139 mmol/L (ref 134–144)

## 2016-11-08 NOTE — Progress Notes (Signed)
Cardiology Office Note:    Date:  11/08/2016   ID:  Eduardo [name], DOB 1965-04-28, MRN [id number]  PCP:  Patient, No Pcp Per  Cardiologist:  Dr. Sherren Mocha    Referring MD: No ref. provider found   Chief Complaint  Patient presents with  . Hospitalization Follow-up    NSTEMI; CHF    History of Present Illness:    CURTEZ BRALLIER is a 51 y.o. male with a hx of coronary artery disease status post myocardial infarction in 2008 treated with stenting to the left circumflex, tobacco abuse, alcohol abuse.    He was admitted 10/15-10/21 for ORIF secondary to left tibia/fibula fracture.  He was noted to have chest discomfort postoperatively and was seen by cardiology.  Initially, it was planned to undergo outpatient stress testing as he was felt to be overall stable.  However, he then developed flash pulmonary edema with an elevated troponin (5) and diffuse ST-T wave changes.  Chest CT was negative for pulmonary embolism.  Echocardiogram demonstrated an EF of 20% with grade 2 diastolic dysfunction.  Cardiac catheterization demonstrated severe three-vessel CAD with total occlusion of the proximal circumflex in the previously stented segment and faint left to left collaterals, severe diffuse disease in the proximal and mid LAD and severe mid RCA stenosis.  LVEDP was elevated at 35.  He underwent aggressive management for decompensated heart failure and was seen by Dr. Servando Snare for surgical consultation.  Bypass surgery was recommended once the patient recovered.  The patient noted he is hesitant to undergo bypass surgery.  LifeVest was recommended and this was placed prior to discharge.  Of note, he was managed for potential alcohol withdrawal during his admission.  Mr. Vanatta returns for follow up.  He is here with his girlfriend.  He has not yet followed up with orthopedics (Dr. Berenice Primas).  He is still in a cast.  He has not had chest discomfort or significant dyspnea.  He denies orthopnea, PND or  significant edema.  He still has some pain in his left leg and thinks it may be swollen under his cast.  He denies syncope or dizziness.  He has not had any alarms from his LifeVest.  He continues to smoke.  He has cut back on his alcohol.  Prior CV studies:   The following studies were reviewed today:  Cardiac catheterization 10/22/16 LAD mid 80, distal 80, D2 ostial 75 LCx proximal stent 100, distal 100, OM 3 100 RCA proximal 50, mid 95 LVEDP 35  Echocardiogram 10/22/16 EF 20, diffuse HK, grade 2 diastolic dysfunction, mild MR, mildly reduced RV SF  Past Medical History:  Diagnosis Date  . Anxiety   . Dyspnea   . Myocardial infarction [facility])    July 2008    Past Surgical History:  Procedure Laterality Date  . ANKLE SURGERY    . CARDIAC CATHETERIZATION     stent placed 2008  . LEFT HEART CATH AND CORONARY ANGIOGRAPHY N/A 10/22/2016   Procedure: LEFT HEART CATH AND CORONARY ANGIOGRAPHY;  Surgeon: Belva Crome, MD;  Location: [place] CV LAB;  Service: Cardiovascular;  Laterality: N/A;  . SHOULDER SURGERY    . TIBIA IM NAIL INSERTION Left 10/21/2016   Procedure: INTRAMEDULLARY (IM) ROD TIBIA/FIBULA FRACTURE;  Surgeon: Dorna Leitz, MD;  Location: [place];  Service: Orthopedics;  Laterality: Left;  . TONSILLECTOMY      Current Medications: Current Meds  Medication Sig  . aspirin EC 81 MG EC tablet Take 1 tablet (  81 mg total) by mouth daily.  Marland Kitchen atorvastatin (LIPITOR) 80 MG tablet Take 1 tablet (80 mg total) by mouth daily at 6 PM.  . bisacodyl (DULCOLAX) 5 MG EC tablet Take 1 tablet (5 mg total) by mouth daily as needed for moderate constipation.  . clopidogrel (PLAVIX) 75 MG tablet Take 1 tablet (75 mg total) by mouth daily.  Marland Kitchen docusate sodium (COLACE) 100 MG capsule Take 1 capsule (100 mg total) by mouth 2 (two) times daily.  . furosemide (LASIX) 20 MG tablet Take 1 tablet (20 mg total) by mouth daily.  Marland Kitchen lisinopril (PRINIVIL,ZESTRIL) 2.5 MG tablet Take 1 tablet (2.5 mg  total) by mouth daily.  . metoprolol succinate (TOPROL-XL) 25 MG 24 hr tablet Take 1 tablet (25 mg total) by mouth daily.  . Omega-3 Fatty Acids (FISH OIL PO) Take by mouth.  . oxyCODONE-acetaminophen (PERCOCET/ROXICET) 5-325 MG tablet TAKE 1 TO 2 TABLETS BY MOUTH EVERY 6 TO 8 HOURS AS NEEDED FOR SEVERE PAIN  . spironolactone (ALDACTONE) 25 MG tablet Take 0.5 tablets (12.5 mg total) by mouth daily.  Marland Kitchen terbinafine (LAMISIL) 1 % cream Apply topically daily.     Allergies:   Penicillins   Social History  Substance Use Topics  . Smoking status: Current Every Day Smoker    Packs/day: 1.00    Years: 40.00    Types: Cigarettes  . Smokeless tobacco: Never Used  . Alcohol use Yes     Family Hx: The patient's family history includes Mitral valve prolapse in his mother and sister; Stroke in his father.  ROS:   Please see the history of present illness.    ROS All other systems reviewed and are negative.   EKGs/Labs/Other Test Reviewed:    EKG:  EKG is  ordered today.  The ekg ordered today demonstrates normal sinus rhythm, heart rate 83, normal axis, inferior Q waves,?Anterior Q waves, nonspecific ST-T wave changes, QTC 439 ms  Recent Labs: 10/22/2016: B Natriuretic Peptide 233.5 10/24/2016: Magnesium 1.8 10/26/2016: TSH 4.432 10/27/2016: BUN 21; Creatinine, Ser 1.21; Hemoglobin 13.2; Platelets 426; Potassium 4.8; Sodium 131   Recent Lipid Panel No results found for: CHOL, TRIG, HDL, CHOLHDL, LDLCALC, LDLDIRECT  Physical Exam:    VS:  BP 94/60   Pulse 84   Ht 5' 7"  (1.702 m)   SpO2 97%     Wt Readings from Last 3 Encounters:  10/27/16 151 lb 4.8 oz (68.6 kg)  10/11/16 180 lb (81.6 kg)     Physical Exam  Constitutional: He is oriented to person, place, and time. He appears well-developed and well-nourished. No distress.  HENT:  Head: Normocephalic and atraumatic.  Neck: No JVD (At 90 degrees.) present.  Cardiovascular: Normal rate and regular rhythm.   No murmur  heard. Pulmonary/Chest: Effort normal. He has no rales.  Abdominal: Soft.  Musculoskeletal: He exhibits no edema.  Left leg in cast up to the knee  Neurological: He is alert and oriented to person, place, and time.  Skin: Skin is warm and dry.    ASSESSMENT:    1. Non-ST elevation (NSTEMI) myocardial infarction ([place])   2. Coronary artery disease involving native coronary artery of native heart without angina pectoris   3. Chronic combined systolic and diastolic heart failure ([place])   4. Dilated cardiomyopathy ([place])   5. Tobacco abuse   6. Alcohol abuse   7. Hyperlipidemia, unspecified hyperlipidemia type   8. History of fracture of lower leg    PLAN:    In  order of problems listed above:  1. Non-ST elevation (NSTEMI) myocardial infarction [facility]) History of recent non-ST elevation myocardial infarction during hospitalization for left leg fracture status post ORIF.  He has three-vessel disease on cardiac catheterization and will require bypass surgery at some point.  Continue aspirin, Plavix, statin, beta-blocker, ACE inhibitor.  2.  Coronary artery disease involving native coronary artery of native heart without angina pectoris History of prior myocardial infarction treated with stenting to the LCx.  Recent admission with non-ST elevation myocardial infarction and three-vessel CAD with occluded LCx at the previous stent.  He has severely reduced LV function.  As noted, he will require bypass at some point in the near future after recovering from his leg fracture.  He denies anginal symptoms.  -Continue aspirin, Plavix, statin, ACE inhibitor, beta-blocker.  -We will contact Dr. Everrett Coombe office to make sure he has follow up at some point  3.  Chronic combined systolic and diastolic heart failure (HCC) Volume status stable.  Continue beta-blocker, ACE inhibitor, spironolactone.  Obtain follow-up BMET today.  4.  Dilated cardiomyopathy ([place]) He remains on LifeVest.  We discussed the  fact that he will need to remain on this until 90 days post bypass.  His ejection fraction will be reassessed at that time to determine if he is a candidate for ICD therapy.  5.  Tobacco abuse I have recommended cessation.  He inquired today about Wellbutrin.  Given his history of alcohol abuse and potential for lowering of the seizure threshold with Wellbutrin therapy, I am hesitant to use this medication.  He is not interested in Chantix.  I discussed with him setting a quit date and using nicotine patches.  If he is successful with discontinuation of alcohol use and continues to struggle with quitting smoking, we can certainly consider Wellbutrin in the future.  6.  Alcohol abuse He has cut back on his drinking.  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  7.  Hyperlipidemia, unspecified hyperlipidemia type Continue statin.  Arrange follow-up CMET, lipids.   8. Leg Fracture We discussed the importance of managing this so that is able to withstand recovery from coronary artery bypass grafting.  He has not yet seen the orthopedist.  I have asked him to call their office as soon as possible.   Dispo:  Return in about 4 weeks (around 12/06/2016) for Close Follow Up, w/ Dr. Burt Knack or PA/NP.   Medication Adjustments/Labs and Tests Ordered: Current medicines are reviewed at length with the patient today.  Concerns regarding medicines are outlined above.  Tests Ordered: Orders Placed This Encounter  Procedures  . Comp Met (CMET)  . Lipid Profile  . Basic Metabolic Panel (BMET)  . Ambulatory referral to Internal Medicine  . EKG 12-Lead   Medication Changes: No orders of the defined types were placed in this encounter.   Signed, Richardson Dopp, PA-C  11/08/2016 10:52 AM    [facility] [place], [place], [place]  [address] Phone: [phone]; Fax: [phone]

## 2016-11-08 NOTE — Patient Instructions (Addendum)
Medication Instructions:  No changes.  Continue current medications.  Labwork: 1. Today - BMET  2. IN 4-6 weeks - CMET, Lipids  Testing/Procedures: None   Follow-Up: 1. Dr. Tonny BollmanMichael Cooper or PA/NP in 3-4 weeks.  2. DR. Dennie MaizesGerhardt's office will call you about an appt   3. YOU ARE BEING REFERRED TO PRIMARY CARE  Any Other Special Instructions Will Be Listed Below (If Applicable). 1. Call your orthopedist for follow up (Dr. Luiz BlareGraves)  2. We will ask Dr. Dennie MaizesGerhardt's office to arrange a follow up.    If you need a refill on your cardiac medications before your next appointment, please call your pharmacy.

## 2016-11-20 ENCOUNTER — Other Ambulatory Visit: Payer: Self-pay | Admitting: *Deleted

## 2016-11-20 MED ORDER — METOPROLOL SUCCINATE ER 25 MG PO TB24: 25.0000 mg | ORAL_TABLET | Freq: Every day | ORAL | 11 refills | Status: DC

## 2016-11-20 MED ORDER — ATORVASTATIN CALCIUM 80 MG PO TABS: 80.0000 mg | ORAL_TABLET | Freq: Every day | ORAL | 11 refills | Status: DC

## 2016-11-20 MED ORDER — LISINOPRIL 2.5 MG PO TABS: 2.5000 mg | ORAL_TABLET | Freq: Every day | ORAL | 11 refills | Status: DC

## 2016-11-20 MED ORDER — FUROSEMIDE 20 MG PO TABS: 20.0000 mg | ORAL_TABLET | Freq: Every day | ORAL | 11 refills | Status: DC

## 2016-11-20 MED ORDER — SPIRONOLACTONE 25 MG PO TABS: 12.5000 mg | ORAL_TABLET | Freq: Every day | ORAL | 11 refills | Status: DC

## 2016-11-20 MED ORDER — CLOPIDOGREL BISULFATE 75 MG PO TABS: 75.0000 mg | ORAL_TABLET | Freq: Every day | ORAL | 11 refills | Status: DC

## 2016-11-20 NOTE — Telephone Encounter (Signed)
[name], ok to send in Rx's.

## 2016-11-20 NOTE — Telephone Encounter (Signed)
These meds were filled by [facility] Adolpharole Hall, DO when [facility] was [facility]. Patient saw Tereso NewcomerScott Weaver on 11/08/16. Okay to refill all these? Please advise. Thanks, MI

## 2016-11-25 ENCOUNTER — Encounter: Payer: Self-pay | Admitting: Physician Assistant

## 2016-11-25 NOTE — Progress Notes (Signed)
See below regarding msgs sent to me.  Eduardo Bollmanooper, Michael, MD  Beatrice LecherWeaver, Scott T, PA-C; Nevaya Nagele, DowneyDayna N, PA-C        Scott and Sierra ViewDayna - I can see him back after his next visit with you, [name]. I could discuss options with him if he is still balking at surgery.   thx Kathlene November- Mike   Previous Messages    ----- Message -----  From: Kennon RoundsWeaver, Scott T, PA-C  Sent: 11/11/2016 12:09 PM  To: Laurann Montanaayna N Levon Boettcher, PA-C, Delight OvensEdward B Gerhardt, MD, *   Thanks, [name].  It was hard to tell what his intentions are. He seems to be drinking less. But, he seems to want to get his leg taken care of first. I asked him to see the ortho soon. He has a follow up with Kaydie Petsch at the end of Nov. I am attaching her to this message for her FYI.  Scott   ----- Message -----  From: Delight OvensGerhardt, Edward B, MD  Sent: 11/10/2016  7:01 PM  To: Particia NearingJo Lene R Carter, RN, Beatrice LecherScott T Weaver, PA-C, *   Evaristo BuryJo Lene, He refused surgery so does not need appointment until he has stopped smoking, no alcohol intake and changes his mind about considering CABG.  EBG

## 2016-12-05 ENCOUNTER — Encounter: Payer: Self-pay | Admitting: Physician Assistant

## 2016-12-05 NOTE — Progress Notes (Signed)
Cardiology Office Note    Date:  12/06/2016  ID:  Eduardo [name], DOB 02/16/1965, MRN [id number] PCP:  Patient, No Pcp Per  Cardiologist:  Dr. Excell Seltzerooper   Chief Complaint: f/u CAD  History of Present Illness:  Eduardo [name] is a 51 y.o. male with history of CAD (MI in 2008 treated with stenting to the left circumflex, post-op NSTEMI with progressive 3V disease 10/2016), ischemic cardiomyopathy with chronic combined CHF as of 10/2016, tobacco abuse, alcohol abuse, anxiety who presents for f/u CAD.  He was admitted 10/15-10/21 for ORIF secondary to left tibia/fibula fracture. He was noted to have chest discomfort postoperatively and was seen by cardiology.  Initially, it was planned to undergo outpatient stress testing as he was felt to be overall stable. However, he then developed flash pulmonary edema with an elevated troponin (5) and diffuse ST-T wave changes. Chest CT was negative for pulmonary embolism. Echocardiogram demonstrated an EF of 20%, grade 2 diastolic dysfunction and mild mitral regurgitation. Cardiac catheterization demonstrated severe three-vessel CAD with total occlusion of the proximal circumflex in the previously stented segment and faint left to left collaterals, severe diffuse disease in the proximal and mid LAD and severe mid RCA stenosis; LVEDP elevated at 35. He underwent aggressive management for decompensated heart failure. Bypass surgery was recommended by cardiothoracic surgery once the patient recovered. Lifevest was place prior to discharge. The patient expressed hesitance towards this in followup 10/2016. Dr. Tyrone SageGerhardt sent our office a message 11/10/16 indicating the patient had refused surgery so was advised to stop smoking/EtOH and call them if he changes his mind. Last labs 10/2016 showed Na 131, K 4.8, BUN 21, Cr 1.21, WBC 15k, Hgb 13.2, plt 426, TSH wnl.  He presents back for follow-up today with his girlfriend. He remains unconvinced that he needs bypass surgery  because he's been able to get back to ADLs without any angina. He says recently his GF had to park far away at [employer] and he was able to walk up the parking lot on crutches without any chest pain or dyspnea. He no longer is wearing the LifeVest because he found it cumbersome and didn't see a benefit if he wasn't having any symptoms. No palpitations, edema, orthopnea, syncope. His weight is up 9lb from last visit but this seems like an outlier as prior DC weight was 160lb. He is taking 25mg  of spironolactone instead of 12.5mg  for unclear reasons. He continues to smoke. He drinks 2-3 drinks 3x per week.   Past Medical History:  Diagnosis Date  . Alcohol abuse   . Anxiety   . CAD in native artery    a. MI in 2008 treated with stenting to the left circumflex. b. post-op NSTEMI with progressive 3V disease 10/2016 - recommended for bypass after recovering from ortho surgery but patient declined.  . Chronic combined systolic and diastolic CHF (congestive heart failure) (HCC)   . Dyspnea   . Ischemic cardiomyopathy   . Mild mitral regurgitation   . Myocardial infarction [facility](HCC)    July 2008  . Tobacco abuse     Past Surgical History:  Procedure Laterality Date  . ANKLE SURGERY    . CARDIAC CATHETERIZATION     stent placed 2008  . LEFT HEART CATH AND CORONARY ANGIOGRAPHY N/A 10/22/2016   Procedure: LEFT HEART CATH AND CORONARY ANGIOGRAPHY;  Surgeon: Lyn RecordsSmith, Henry W, MD;  Location: [facility];  Service: Cardiovascular;  Laterality: N/A;  . SHOULDER SURGERY    .  TIBIA IM NAIL INSERTION Left 10/21/2016   Procedure: INTRAMEDULLARY (IM) ROD TIBIA/FIBULA FRACTURE;  Surgeon: Jodi GeraldsGraves, John, MD;  Location: [facility] OR;  Service: Orthopedics;  Laterality: Left;  . TONSILLECTOMY      Current Medications: Current Meds  Medication Sig  . aspirin EC 81 MG EC tablet Take 1 tablet (81 mg total) by mouth daily.  Marland Kitchen. atorvastatin (LIPITOR) 80 MG tablet Take 1 tablet (80 mg total) daily at 6 PM by mouth.  .  bisacodyl (DULCOLAX) 5 MG EC tablet Take 1 tablet (5 mg total) by mouth daily as needed for moderate constipation.  . clopidogrel (PLAVIX) 75 MG tablet Take 1 tablet (75 mg total) daily by mouth.  . docusate sodium (COLACE) 100 MG capsule Take 1 capsule (100 mg total) by mouth 2 (two) times daily.  . furosemide (LASIX) 20 MG tablet Take 1 tablet (20 mg total) daily by mouth.  Marland Kitchen. lisinopril (PRINIVIL,ZESTRIL) 2.5 MG tablet Take 1 tablet (2.5 mg total) daily by mouth.  . metoprolol succinate (TOPROL-XL) 25 MG 24 hr tablet Take 1 tablet (25 mg total) daily by mouth.  . Omega-3 Fatty Acids (FISH OIL PO) Take by mouth.  . oxyCODONE-acetaminophen (PERCOCET/ROXICET) 5-325 MG tablet TAKE 1 TO 2 TABLETS BY MOUTH EVERY 6 TO 8 HOURS AS NEEDED FOR SEVERE PAIN  . spironolactone (ALDACTONE) 25 MG tablet Take 25 mg by mouth daily.  Marland Kitchen. terbinafine (LAMISIL) 1 % cream Apply topically daily.  . [DISCONTINUED] spironolactone (ALDACTONE) 25 MG tablet Take 0.5 tablets (12.5 mg total) daily by mouth.     Allergies:   Penicillins   Social History   Socioeconomic History  . Marital status: Single    Spouse name: None  . Number of children: None  . Years of education: None  . Highest education level: None  Social Needs  . Financial resource strain: None  . Food insecurity - worry: None  . Food insecurity - inability: None  . Transportation needs - medical: None  . Transportation needs - non-medical: None  Occupational History  . None  Tobacco Use  . Smoking status: Current Every Day Smoker    Packs/day: 1.00    Years: 40.00    Pack years: 40.00    Types: Cigarettes  . Smokeless tobacco: Never Used  Substance and Sexual Activity  . Alcohol use: Yes  . Drug use: No  . Sexual activity: None  Other Topics Concern  . None  Social History Narrative  . None     Family History:  Family History  Problem Relation Age of Onset  . Mitral valve prolapse Mother   . Stroke Father   . Mitral valve prolapse  Sister        mitral valve surgery    ROS:   Please see the history of present illness.  All other systems are reviewed and otherwise negative.    PHYSICAL EXAM:   VS:  BP 100/70   Pulse (!) 101   Ht 5\' 7"  (1.702 m)   Wt 159 lb 6.4 oz (72.3 kg)   SpO2 98%   BMI 24.97 kg/m   BMI: Body mass index is 24.97 kg/m. GEN: Well nourished, well developed thin WM, in no acute distress  HEENT: normocephalic, atraumatic Neck: no JVD, carotid bruits, or masses Cardiac: RRR; no murmurs, rubs, or gallops, no edema, L leg in boot Respiratory:  clear to auscultation bilaterally, normal work of breathing GI: soft, nontender, nondistended, + BS MS: no deformity or atrophy  Skin: warm  and dry, no rash Neuro:  Alert and Oriented x 3, Strength and sensation are intact, follows commands Psych: euthymic mood, full affect  Wt Readings from Last 3 Encounters:  12/06/16 159 lb 6.4 oz (72.3 kg)  10/27/16 151 lb 4.8 oz (68.6 kg)  10/11/16 180 lb (81.6 kg)      Studies/Labs Reviewed:   EKG:  EKG was ordered today and personally reviewed by me and demonstrates sinus tach 101bpm, prior inferior infarct, nonspecific ST-T changes with TWI II, III, avF, V5-V6. No sig change from prior.  Recent Labs: 10/22/2016: B Natriuretic Peptide 233.5 10/24/2016: Magnesium 1.8 10/26/2016: TSH 4.432 10/27/2016: Hemoglobin 13.2; Platelets 426 11/08/2016: BUN 16; Creatinine, Ser 1.16; Potassium 4.7; Sodium 139   Lipid Panel No results found for: CHOL, TRIG, HDL, CHOLHDL, VLDL, LDLCALC, LDLDIRECT  Additional studies/ records that were reviewed today include: Summarized above.    ASSESSMENT & PLAN:   1. CAD - he continues to decline bypass surgery at this time. Dr. Excell Seltzer saw the patient with me and reiterated the rationale for bypass surgery but the patient does not wish to proceed at this time. He is afraid of having surgery. The issue of possible multivessel PCI was discussed although less ideal given his low  EF and multiple blockages. At this time, per discussion between Dr. Excell Seltzer and the patient, the plan is to continue medical therapy and f/u in 3 months to give him more time to recover, at which time the issue of CABG vs PCI can be revisited. Risk factor reduction importance was reinforced. 2. Ischemic cardiomyopathy/chronic systolic CHF - appears euvolemic. Weight is up 8lb from last visit but this may be an outlier as his prior discharge weight was around current weight today. Continue current meds but check labs today. HR is borderline elevated but there is no current room to titrate meds with his borderline blood pressure. As he is feeling well, we did not make any changes today. Will plan echo in late January (3 months after med titration started in October) to reassess LVEF. Reviewed 2g sodium restriction, 2L fluid restriction, daily weights with patient. 3. Tobacco abuse - counseled on importance of discontinuing. He has struggled with this. In the past he was not felt to be a good candidate for Wellbutrin due to this lowering seizure threshold (in setting of ongoing EtOH) and has not been interested in Chantix. 4. Alcohol abuse - counseled on importance of discontinuing.  Disposition: F/u with Dr. Excell Seltzer in 3 months.   Medication Adjustments/Labs and Tests Ordered: Current medicines are reviewed at length with the patient today.  Concerns regarding medicines are outlined above. Medication changes, Labs and Tests ordered today are summarized above and listed in the Patient Instructions accessible in Encounters.   Signed, Laurann Montana, PA-C  12/06/2016 10:41 AM    [facility] [address] [place], [place], [place]  [address] Phone: [phone]; Fax: [phone]

## 2016-12-06 ENCOUNTER — Encounter: Payer: Self-pay | Admitting: Physician Assistant

## 2016-12-06 ENCOUNTER — Ambulatory Visit: Payer: BLUE CROSS/BLUE SHIELD | Admitting: Physician Assistant

## 2016-12-06 VITALS — BP 100/70 | HR 101 | Ht 67.0 in | Wt 159.4 lb

## 2016-12-06 DIAGNOSIS — Z720 Tobacco use: Secondary | ICD-10-CM | POA: Diagnosis not present

## 2016-12-06 DIAGNOSIS — F1010 Alcohol abuse, uncomplicated: Secondary | ICD-10-CM | POA: Diagnosis not present

## 2016-12-06 DIAGNOSIS — I2510 Atherosclerotic heart disease of native coronary artery without angina pectoris: Secondary | ICD-10-CM | POA: Diagnosis not present

## 2016-12-06 DIAGNOSIS — I5042 Chronic combined systolic (congestive) and diastolic (congestive) heart failure: Secondary | ICD-10-CM

## 2016-12-06 LAB — CBC
HEMATOCRIT: 38 % (ref 37.5–51.0)
Hemoglobin: 13 g/dL (ref 13.0–17.7)
MCH: 32.8 pg (ref 26.6–33.0)
MCHC: 34.2 g/dL (ref 31.5–35.7)
MCV: 96 fL (ref 79–97)
PLATELETS: 289 10*3/uL (ref 150–379)
RBC: 3.96 x10E6/uL — ABNORMAL LOW (ref 4.14–5.80)
RDW: 12.7 % (ref 12.3–15.4)
WBC: 12.4 10*3/uL — ABNORMAL HIGH (ref 3.4–10.8)

## 2016-12-06 LAB — BASIC METABOLIC PANEL
BUN / CREAT RATIO: 10 (ref 9–20)
BUN: 9 mg/dL (ref 6–24)
CO2: 25 mmol/L (ref 20–29)
CREATININE: 0.9 mg/dL (ref 0.76–1.27)
Calcium: 9.5 mg/dL (ref 8.7–10.2)
Chloride: 98 mmol/L (ref 96–106)
GFR, EST AFRICAN AMERICAN: 112 mL/min/{1.73_m2} (ref >59)
GFR, EST NON AFRICAN AMERICAN: 97 mL/min/{1.73_m2} (ref >59)
Glucose: 80 mg/dL (ref 65–99)
Potassium: 4.6 mmol/L (ref 3.5–5.2)
SODIUM: 139 mmol/L (ref 134–144)

## 2016-12-06 NOTE — Patient Instructions (Addendum)
Medication Instructions:  Your physician recommends that you continue on your current medications as directed. Please refer to the Current Medication list given to you today.   Labwork: TODAY;  BMET & CBC  Testing/Procedures: .Your physician has requested that you have an echocardiogram AFTER 01/09/17. Echocardiography is a painless test that uses sound waves to create images of your heart. It provides your doctor with information about the size and shape of your heart and how well your heart's chambers and valves are working. This procedure takes approximately one hour. There are no restrictions for this procedure.    Follow-Up: Your physician recommends that you schedule a follow-up appointment in: 3 MONTHS WITH DR. Excell SeltzerOOPER  Any Other Special Instructions Will Be Listed Below (If Applicable).  Echocardiogram An echocardiogram, or echocardiography, uses sound waves (ultrasound) to produce an image of your heart. The echocardiogram is simple, painless, obtained within a short period of time, and offers valuable information to your health care provider. The images from an echocardiogram can provide information such as:  Evidence of coronary artery disease (CAD).  Heart size.  Heart muscle function.  Heart valve function.  Aneurysm detection.  Evidence of a past heart attack.  Fluid buildup around the heart.  Heart muscle thickening.  Assess heart valve function.  Tell a health care provider about:  Any allergies you have.  All medicines you are taking, including vitamins, herbs, eye drops, creams, and over-the-counter medicines.  Any problems you or family members have had with anesthetic medicines.  Any blood disorders you have.  Any surgeries you have had.  Any medical conditions you have.  Whether you are pregnant or may be pregnant. What happens before the procedure? No special preparation is needed. Eat and drink normally. What happens during the  procedure?  In order to produce an image of your heart, gel will be applied to your chest and a wand-like tool (transducer) will be moved over your chest. The gel will help transmit the sound waves from the transducer. The sound waves will harmlessly bounce off your heart to allow the heart images to be captured in real-time motion. These images will then be recorded.  You may need an IV to receive a medicine that improves the quality of the pictures. What happens after the procedure? You may return to your normal schedule including diet, activities, and medicines, unless your health care provider tells you otherwise. This information is not intended to replace advice given to you by your health care provider. Make sure you discuss any questions you have with your health care provider. Document Released: 12/22/1999 Document Revised: 08/12/2015 Document Reviewed: 08/31/2012 Elsevier Interactive Patient Education  2017 [employer]    If you need a refill on your cardiac medications before your next appointment, please call your pharmacy.

## 2016-12-24 ENCOUNTER — Ambulatory Visit: Payer: BLUE CROSS/BLUE SHIELD | Admitting: Internal Medicine

## 2017-01-17 ENCOUNTER — Other Ambulatory Visit (HOSPITAL_COMMUNITY): Payer: BLUE CROSS/BLUE SHIELD

## 2017-01-27 ENCOUNTER — Encounter: Payer: Self-pay | Admitting: Physician Assistant

## 2017-01-27 NOTE — Progress Notes (Signed)
Received following info via staff message: Angelena Soleouglas, Edwina V  Dunn, Dayna N, PA-C        Just an BuckmanFYI.  Spoke with patient to schedule echocardiogram. Patient had declined the echocardiogram due to insurance.     Will route to primary cardiologist to make him aware. Dayna Dunn PA-C

## 2017-02-28 ENCOUNTER — Ambulatory Visit: Payer: BLUE CROSS/BLUE SHIELD | Admitting: Cardiovascular Disease

## 2017-03-27 ENCOUNTER — Ambulatory Visit: Payer: BLUE CROSS/BLUE SHIELD | Admitting: Cardiovascular Disease

## 2017-09-13 ENCOUNTER — Other Ambulatory Visit: Payer: Self-pay | Admitting: Physician Assistant

## 2017-10-13 ENCOUNTER — Other Ambulatory Visit: Payer: Self-pay | Admitting: Physician Assistant

## 2017-11-13 ENCOUNTER — Other Ambulatory Visit: Payer: Self-pay | Admitting: Physician Assistant

## 2017-11-16 ENCOUNTER — Other Ambulatory Visit: Payer: Self-pay | Admitting: Physician Assistant

## 2017-11-18 ENCOUNTER — Telehealth: Payer: Self-pay | Admitting: *Deleted

## 2017-11-18 NOTE — Telephone Encounter (Signed)
PT CALLED TRYING TO GET REFILLS.  SPOKE WITH PT  HAS LOST JOB AND NO INCOME. HE IS UNABLE TO COME IN FOR APPT TO REFILL MEDICATIONS. PT WAS ENCOURAGE TO LOOK FOR POSSIBILITIES SUCH  AS  [facility].

## 2017-12-15 ENCOUNTER — Other Ambulatory Visit: Payer: Self-pay | Admitting: Physician Assistant

## 2018-01-06 ENCOUNTER — Other Ambulatory Visit: Payer: Self-pay | Admitting: Physician Assistant

## 2018-01-12 ENCOUNTER — Other Ambulatory Visit: Payer: Self-pay | Admitting: Physician Assistant

## 2018-01-18 ENCOUNTER — Other Ambulatory Visit: Payer: Self-pay | Admitting: Physician Assistant

## 2018-01-19 ENCOUNTER — Other Ambulatory Visit: Payer: Self-pay | Admitting: Cardiovascular Disease

## 2018-01-19 MED ORDER — FUROSEMIDE 20 MG PO TABS: 20.0000 mg | ORAL_TABLET | Freq: Every day | ORAL | 0 refills | Status: DC

## 2018-01-22 ENCOUNTER — Other Ambulatory Visit: Payer: Self-pay | Admitting: Physician Assistant

## 2018-02-04 ENCOUNTER — Other Ambulatory Visit: Payer: Self-pay | Admitting: Physician Assistant

## 2018-02-25 ENCOUNTER — Other Ambulatory Visit: Payer: Self-pay | Admitting: Physician Assistant

## 2018-03-13 ENCOUNTER — Other Ambulatory Visit: Payer: Self-pay | Admitting: Cardiovascular Disease

## 2018-04-23 IMAGING — CT CT ANGIO CHEST
3 of 7 series · 17 of 36 positions shown · IV contrast (Omni 300)
Comparison: None.

CLINICAL DATA: Shortness of breath. Status post ORIF left lower
leg.

EXAM:
CT ANGIOGRAPHY CHEST WITH CONTRAST
TECHNIQUE: Multidetector CT imaging of the chest was performed using the
standard protocol during bolus administration of intravenous
contrast. Multiplanar CT image reconstructions and MIPs were
obtained to evaluate the vascular anatomy.
CONTRAST:  100 mL Isovue 370

[Series 6: pe thins · axial · 0.77mm/px · z∈[+1707,+1985]mm · 12 of 330 slices shown]
[im 26/330  lung]
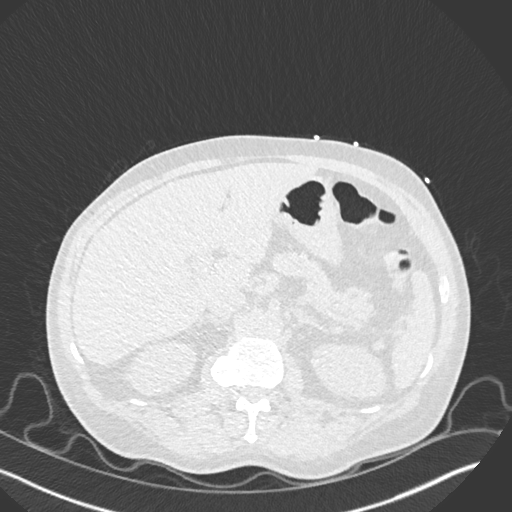
[im 51/330  mediastinal]
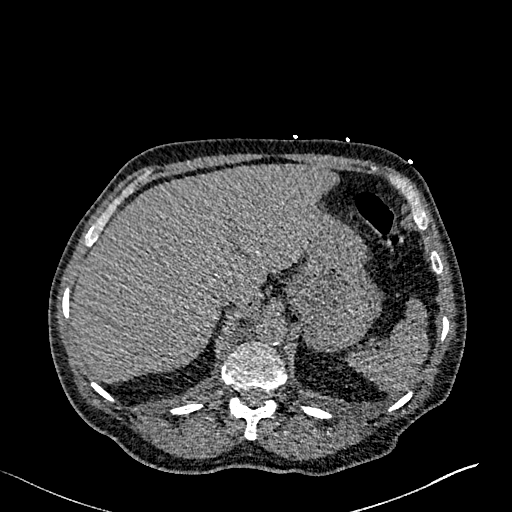
[im 76/330  lung]
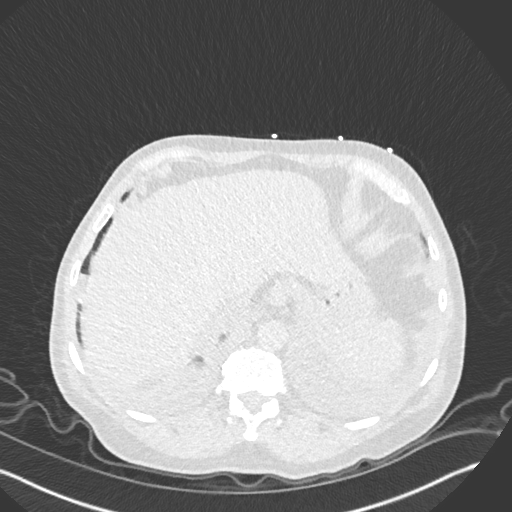
[im 102/330  mediastinal]
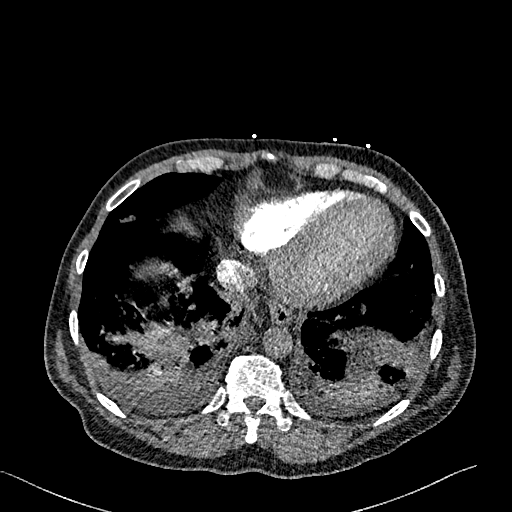
[im 127/330  lung]
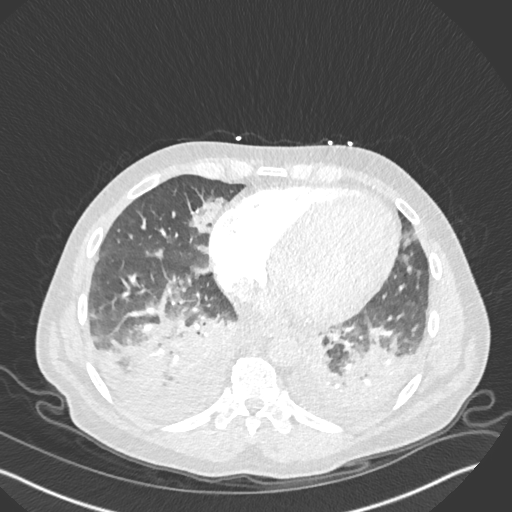
[im 152/330  mediastinal]
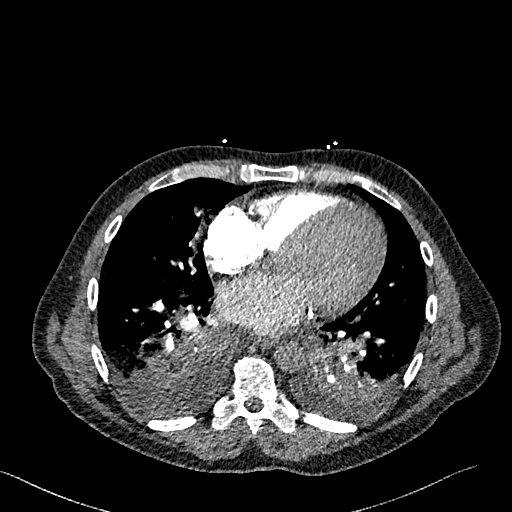
[im 178/330  lung]
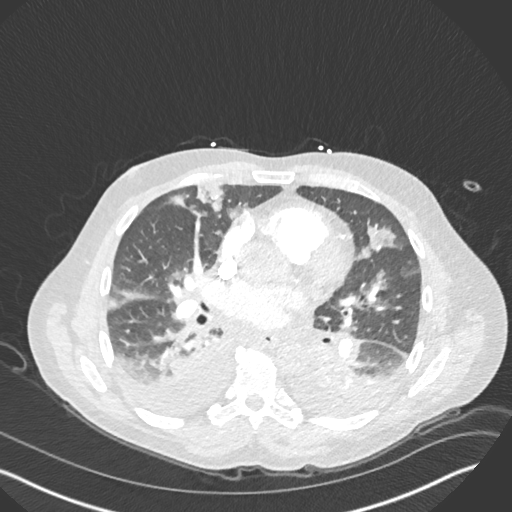
[im 203/330  mediastinal]
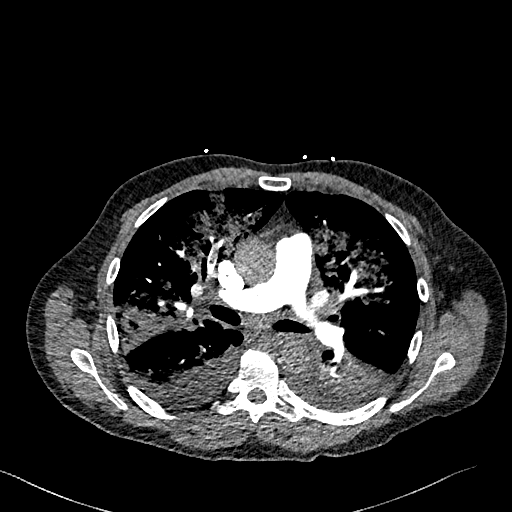
[im 228/330  lung]
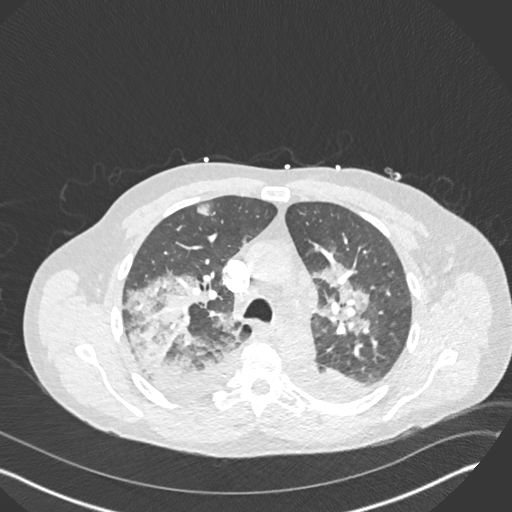
[im 254/330  mediastinal]
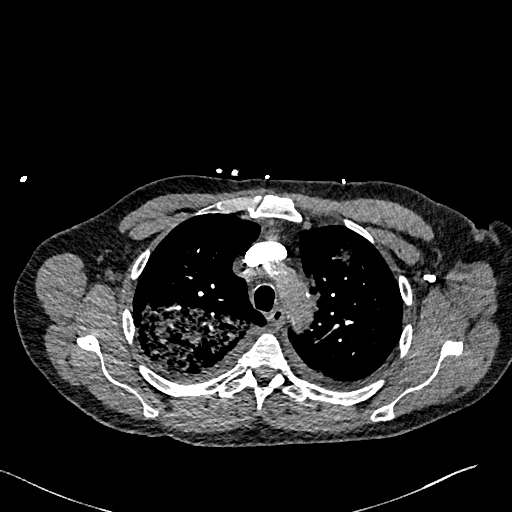
[im 279/330  lung]
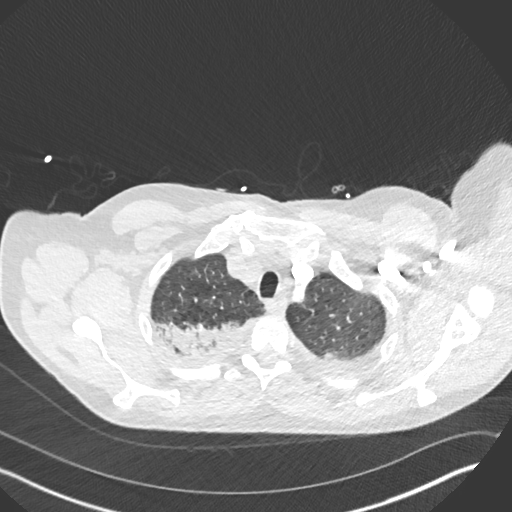
[im 304/330  mediastinal]
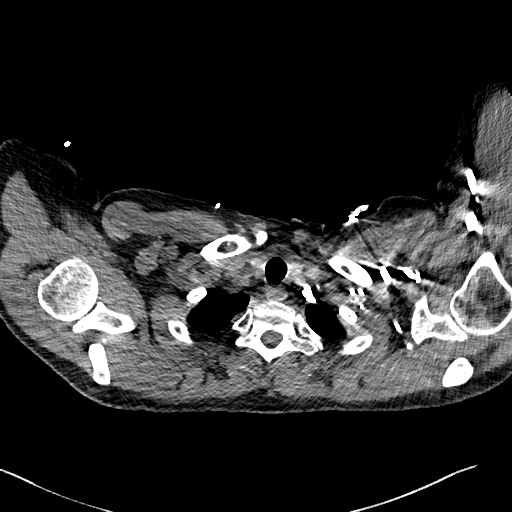

[Series 7: pe lung · axial · 0.70mm/px · z∈[+1779,+1953]mm · 4 of 145 slices shown]
[im 29/145  mediastinal]
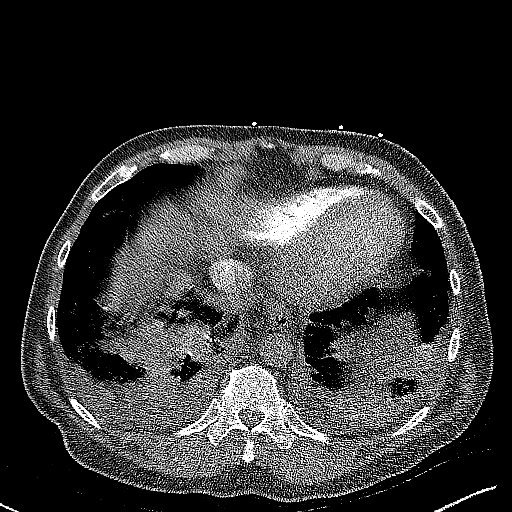
[im 58/145  mediastinal]
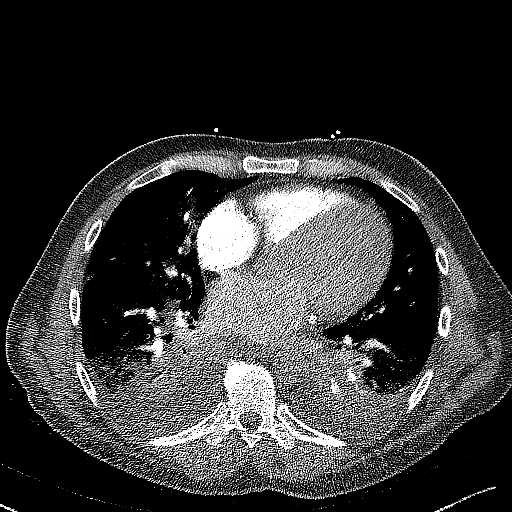
[im 87/145  mediastinal]
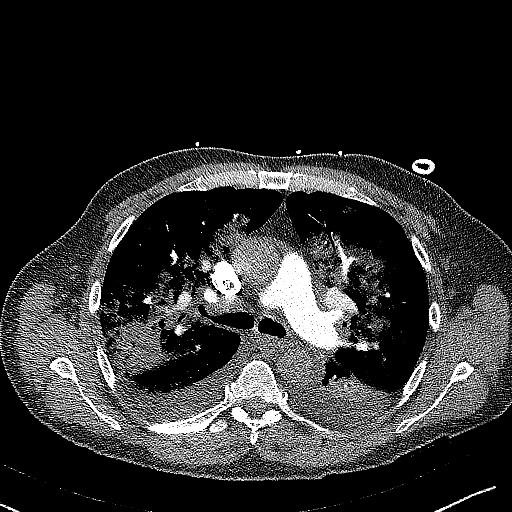
[im 116/145  mediastinal]
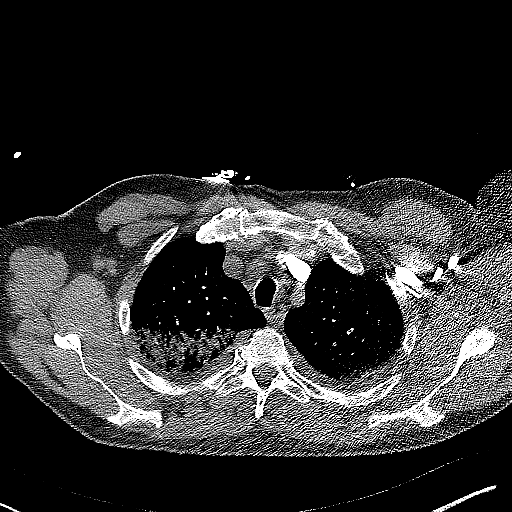

[Series 8: pe 2mm cor · coronal · 0.64mm/px · 1 of 143 slices shown]
[im 72/143  mediastinal]
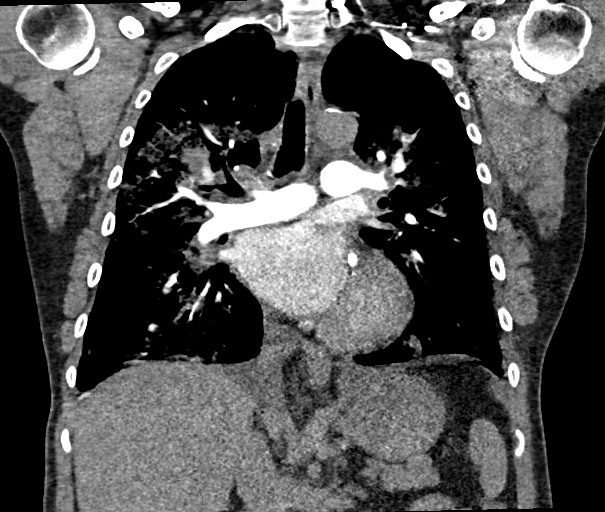

[17 of 36 positions shown; findings below may reference images not displayed]

FINDINGS: Cardiovascular: Satisfactory opacification of the pulmonary arteries
to the segmental level. No evidence of pulmonary embolism. Normal
heart size. No pericardial effusion. Thoracic aorta is normal in
caliber.

Mediastinum/Nodes: No enlarged mediastinal, hilar, or axillary lymph
nodes. Thyroid gland, trachea, and esophagus demonstrate no
significant findings.

Lungs/Pleura: Small bilateral pleural effusions. Bibasilar
atelectasis. Bilateral perihilar patchy airspace disease. More
consolidated airspace disease in the posterior segment of the right
upper lobe.

Upper Abdomen: No acute upper abdominal abnormality.

Musculoskeletal: No acute osseous abnormality. No lytic or sclerotic
osseous lesion.

Review of the MIP images confirms the above findings.
IMPRESSION: 1. No evidence of pulmonary embolus.
2. Pulmonary edema.
3. More consolidated airspace disease in the posterior segment of
the right upper lobe concerning for superimposed pneumonia.

## 2019-08-21 ENCOUNTER — Other Ambulatory Visit: Payer: Self-pay

## 2019-08-21 ENCOUNTER — Inpatient Hospital Stay (HOSPITAL_COMMUNITY)
Admission: EM | Admit: 2019-08-21 | Discharge: 2019-08-24 | DRG: 292 | Disposition: A | Discharge status: Home / Self Care | Payer: Self-pay | Attending: Internal Medicine | Admitting: Internal Medicine

## 2019-08-21 ENCOUNTER — Encounter (HOSPITAL_COMMUNITY): Payer: Self-pay

## 2019-08-21 ENCOUNTER — Emergency Department (HOSPITAL_COMMUNITY): Payer: Self-pay

## 2019-08-21 DIAGNOSIS — I509 Heart failure, unspecified: Secondary | ICD-10-CM

## 2019-08-21 DIAGNOSIS — I493 Ventricular premature depolarization: Secondary | ICD-10-CM | POA: Diagnosis present

## 2019-08-21 DIAGNOSIS — E785 Hyperlipidemia, unspecified: Secondary | ICD-10-CM | POA: Diagnosis present

## 2019-08-21 DIAGNOSIS — F17210 Nicotine dependence, cigarettes, uncomplicated: Secondary | ICD-10-CM | POA: Diagnosis present

## 2019-08-21 DIAGNOSIS — F1010 Alcohol abuse, uncomplicated: Secondary | ICD-10-CM | POA: Diagnosis present

## 2019-08-21 DIAGNOSIS — Z823 Family history of stroke: Secondary | ICD-10-CM

## 2019-08-21 DIAGNOSIS — I2510 Atherosclerotic heart disease of native coronary artery without angina pectoris: Secondary | ICD-10-CM | POA: Diagnosis present

## 2019-08-21 DIAGNOSIS — Z20822 Contact with and (suspected) exposure to covid-19: Secondary | ICD-10-CM | POA: Diagnosis present

## 2019-08-21 DIAGNOSIS — I110 Hypertensive heart disease with heart failure: Principal | ICD-10-CM | POA: Diagnosis present

## 2019-08-21 DIAGNOSIS — I248 Other forms of acute ischemic heart disease: Secondary | ICD-10-CM | POA: Diagnosis present

## 2019-08-21 DIAGNOSIS — E8809 Other disorders of plasma-protein metabolism, not elsewhere classified: Secondary | ICD-10-CM | POA: Diagnosis present

## 2019-08-21 DIAGNOSIS — I5043 Acute on chronic combined systolic (congestive) and diastolic (congestive) heart failure: Secondary | ICD-10-CM | POA: Diagnosis present

## 2019-08-21 DIAGNOSIS — Z9114 Patient's other noncompliance with medication regimen: Secondary | ICD-10-CM

## 2019-08-21 DIAGNOSIS — I255 Ischemic cardiomyopathy: Secondary | ICD-10-CM | POA: Diagnosis present

## 2019-08-21 DIAGNOSIS — R778 Other specified abnormalities of plasma proteins: Secondary | ICD-10-CM | POA: Diagnosis present

## 2019-08-21 DIAGNOSIS — Z9861 Coronary angioplasty status: Secondary | ICD-10-CM

## 2019-08-21 DIAGNOSIS — I252 Old myocardial infarction: Secondary | ICD-10-CM

## 2019-08-21 DIAGNOSIS — Z9119 Patient's noncompliance with other medical treatment and regimen: Secondary | ICD-10-CM

## 2019-08-21 DIAGNOSIS — I5023 Acute on chronic systolic (congestive) heart failure: Secondary | ICD-10-CM

## 2019-08-21 DIAGNOSIS — R000 Tachycardia, unspecified: Secondary | ICD-10-CM | POA: Diagnosis present

## 2019-08-21 DIAGNOSIS — Z720 Tobacco use: Secondary | ICD-10-CM | POA: Diagnosis present

## 2019-08-21 LAB — BASIC METABOLIC PANEL
Anion gap: 14 (ref 5–15)
BUN: 13 mg/dL (ref 6–20)
CO2: 20 mmol/L — ABNORMAL LOW (ref 22–32)
Calcium: 8.9 mg/dL (ref 8.9–10.3)
Chloride: 99 mmol/L (ref 98–111)
Creatinine, Ser: 0.92 mg/dL (ref 0.61–1.24)
GFR calc Af Amer: >60 mL/min (ref >60)
GFR calc non Af Amer: >60 mL/min (ref >60)
Glucose, Bld: 143 mg/dL — ABNORMAL HIGH (ref 70–99)
Potassium: 4.3 mmol/L (ref 3.5–5.1)
Sodium: 133 mmol/L — ABNORMAL LOW (ref 135–145)

## 2019-08-21 LAB — TSH: TSH: 2.06 u[IU]/mL (ref 0.350–4.500)

## 2019-08-21 LAB — CBC
HCT: 40.8 % (ref 39.0–52.0)
HCT: 39.6 % (ref 39.0–52.0)
Hemoglobin: 13.6 g/dL (ref 13.0–17.0)
Hemoglobin: 13.5 g/dL (ref 13.0–17.0)
MCH: 35.6 pg — ABNORMAL HIGH (ref 26.0–34.0)
MCH: 34.9 pg — ABNORMAL HIGH (ref 26.0–34.0)
MCHC: 33.3 g/dL (ref 30.0–36.0)
MCHC: 34.1 g/dL (ref 30.0–36.0)
MCV: 106.8 fL — ABNORMAL HIGH (ref 80.0–100.0)
MCV: 102.3 fL — ABNORMAL HIGH (ref 80.0–100.0)
Platelets: 304 10*3/uL (ref 150–400)
Platelets: 315 10*3/uL (ref 150–400)
RBC: 3.82 MIL/uL — ABNORMAL LOW (ref 4.22–5.81)
RBC: 3.87 MIL/uL — ABNORMAL LOW (ref 4.22–5.81)
RDW: 11.9 % (ref 11.5–15.5)
RDW: 11.9 % (ref 11.5–15.5)
WBC: 14.9 10*3/uL — ABNORMAL HIGH (ref 4.0–10.5)
WBC: 13.9 10*3/uL — ABNORMAL HIGH (ref 4.0–10.5)
nRBC: 0 % (ref 0.0–0.2)
nRBC: 0 % (ref 0.0–0.2)

## 2019-08-21 LAB — HEPATIC FUNCTION PANEL
ALT: 126 U/L — ABNORMAL HIGH (ref 0–44)
AST: 91 U/L — ABNORMAL HIGH (ref 15–41)
Albumin: 3 g/dL — ABNORMAL LOW (ref 3.5–5.0)
Alkaline Phosphatase: 112 U/L (ref 38–126)
Bilirubin, Direct: 0.3 mg/dL — ABNORMAL HIGH (ref 0.0–0.2)
Indirect Bilirubin: 0.9 mg/dL (ref 0.3–0.9)
Total Bilirubin: 1.2 mg/dL (ref 0.3–1.2)
Total Protein: 6.5 g/dL (ref 6.5–8.1)

## 2019-08-21 LAB — TROPONIN I (HIGH SENSITIVITY)
Troponin I (High Sensitivity): 30 ng/L — ABNORMAL HIGH (ref <18)
Troponin I (High Sensitivity): 34 ng/L — ABNORMAL HIGH (ref <18)

## 2019-08-21 LAB — CREATININE, SERUM
Creatinine, Ser: 0.79 mg/dL (ref 0.61–1.24)
GFR calc Af Amer: >60 mL/min (ref >60)
GFR calc non Af Amer: >60 mL/min (ref >60)

## 2019-08-21 LAB — SARS CORONAVIRUS 2 BY RT PCR (HOSPITAL ORDER, PERFORMED IN ~~LOC~~ HOSPITAL LAB): SARS Coronavirus 2: NEGATIVE

## 2019-08-21 LAB — HIV ANTIBODY (ROUTINE TESTING W REFLEX): HIV Screen 4th Generation wRfx: NONREACTIVE

## 2019-08-21 LAB — BRAIN NATRIURETIC PEPTIDE: B Natriuretic Peptide: 1035.8 pg/mL — ABNORMAL HIGH (ref 0.0–100.0)

## 2019-08-21 MED ORDER — ONDANSETRON HCL 4 MG/2ML IJ SOLN: 4.0000 mg | Freq: Four times a day (QID) | INTRAMUSCULAR | Status: DC | PRN

## 2019-08-21 MED ORDER — ENOXAPARIN SODIUM 40 MG/0.4ML ~~LOC~~ SOLN
40.0000 mg | SUBCUTANEOUS | Status: DC
  Administered 2019-08-21 – 2019-08-23 (×3): 40 mg via SUBCUTANEOUS
  Filled 2019-08-21 (×3): qty 0.4

## 2019-08-21 MED ORDER — ASPIRIN EC 81 MG PO TBEC
81.0000 mg | DELAYED_RELEASE_TABLET | Freq: Every day | ORAL | Status: DC
  Administered 2019-08-22 – 2019-08-24 (×3): 81 mg via ORAL
  Filled 2019-08-21 (×4): qty 1

## 2019-08-21 MED ORDER — SODIUM CHLORIDE 0.9% FLUSH
3.0000 mL | Freq: Two times a day (BID) | INTRAVENOUS | Status: DC
  Administered 2019-08-21 – 2019-08-24 (×6): 3 mL via INTRAVENOUS

## 2019-08-21 MED ORDER — FUROSEMIDE 10 MG/ML IJ SOLN
20.0000 mg | Freq: Once | INTRAMUSCULAR | Status: AC
  Administered 2019-08-21: 20 mg via INTRAVENOUS
  Filled 2019-08-21: qty 2

## 2019-08-21 MED ORDER — ACETAMINOPHEN 325 MG PO TABS: 650.0000 mg | ORAL_TABLET | ORAL | Status: DC | PRN

## 2019-08-21 MED ORDER — ATORVASTATIN CALCIUM 80 MG PO TABS
80.0000 mg | ORAL_TABLET | Freq: Every day | ORAL | Status: DC
  Administered 2019-08-21 – 2019-08-24 (×4): 80 mg via ORAL
  Filled 2019-08-21 (×4): qty 1

## 2019-08-21 MED ORDER — FUROSEMIDE 10 MG/ML IJ SOLN
40.0000 mg | Freq: Two times a day (BID) | INTRAMUSCULAR | Status: DC
  Administered 2019-08-21 – 2019-08-23 (×5): 40 mg via INTRAVENOUS
  Filled 2019-08-21 (×5): qty 4

## 2019-08-21 MED ORDER — LISINOPRIL 10 MG PO TABS
10.0000 mg | ORAL_TABLET | Freq: Every day | ORAL | Status: DC
  Administered 2019-08-21 – 2019-08-22 (×2): 10 mg via ORAL
  Filled 2019-08-21 (×3): qty 1

## 2019-08-21 MED ORDER — SODIUM CHLORIDE 0.9% FLUSH: 3.0000 mL | INTRAVENOUS | Status: DC | PRN

## 2019-08-21 MED ORDER — SODIUM CHLORIDE 0.9 % IV SOLN: 250.0000 mL | INTRAVENOUS | Status: DC | PRN

## 2019-08-21 MED ORDER — POTASSIUM CHLORIDE CRYS ER 20 MEQ PO TBCR
20.0000 meq | EXTENDED_RELEASE_TABLET | Freq: Two times a day (BID) | ORAL | Status: DC
  Administered 2019-08-21 – 2019-08-23 (×5): 20 meq via ORAL
  Filled 2019-08-21 (×5): qty 1

## 2019-08-21 NOTE — ED Triage Notes (Signed)
Patient complains of SOB x 2-3 days and reports decreased appetite with same. Patient speaking complete sentences and unsure of weight gain. Denies pain alert and oriented

## 2019-08-21 NOTE — H&P (Signed)
Assessment and plan  Acute/chronic congestive heart failure-systolic  Sinus tachycardia during the second frequent question  Ischemic cardiomyopathy with nonrevascularized (recommended CABG)  Alcohol abuse (greater than 5-10 ounces per day)   Patient presents with acute on chronic congestive heart failure in the setting of known severe nonrevascularized ischemic disease and chronic high-volume alcohol intake.  Tachycardia I presume is secondary to his decompensated heart failure although other causes like pulmonary embolism need to be kept in mind  With decompensation would avoid bb and begin afterload with ACE I and add aldactone in am if BP allows and statins   Will need to attend to possible alcohol withdrawal   He declined cabg before.  Prior to undertaking cath, would need to have films reviewed to decide whether he might be a candidate for complex PCI or he would need to be open to CABG, last time his mothers recent death, he said, impacted his decisions  Still smoking

## 2019-08-21 NOTE — Progress Notes (Signed)
Patient had a 5-beat-run of V-tach and multifocal PVCs. Mayford Knife, MD notified via page. Will continue to monitor.   Bari Edward, RN

## 2019-08-21 NOTE — H&P (Addendum)
Cardiology Admission History and Physical:   Patient ID: Eduardo [name] MRN: [id number]; DOB: Feb 16, 1965   Admission date: 08/21/2019  Primary Care Provider: Patient, No Pcp Per [facility] Cardiologist: Tonny Bollman, MD   [facility] Electrophysiologist:  None    Chief Complaint:  Shortness of breath   Patient Profile:   Eduardo [name] is a 54 y.o. male with the following hx who presents with worsening shortness of breath related to decompensated systolic CHF.    Coronary artery disease   S/p MI in 2008 tx with PCI to LCx   S/p NSTEMI 10/18 >> 3 v CAD and CABG recommended (DC on LifeVest)  Cath 10/18: mLAD 80, dLAD 80, oD2 75; pLCx stent 10, dLCx 100, OM3 100; pRCA 50, mRCA 95  Pt declined CABG and self DCd LifeVest   Combined systolic and diastolic CHF  Ischemic CM  Echo 10/18: EF 20, GRII DD, mild MR, mildly reduced RVSF  Hyperlipidemia   Hypertension   ETOH abuse   Tobacco abuse   History of Present Illness:   Eduardo [name] was lost to follow-up after November 2018.  He has been on disability and has no insurance.  He currently has only been taking aspirin, fish oil and multivitamins.  He smokes about a pack and a half of cigarettes per day and drinks about 4 alcoholic drinks (Seagrams and coke) per day.  Over the past week or so he has noted worsening shortness of breath.  He is here today with his fiance.  She notes that his breathing has gotten much worse over the past couple of days.  He has been short of breath with minimal activity and at rest.  He notes orthopnea and PND.  He has had some lower extremity edema in the past but none recently.  His abdomen does feel swollen.  He is not certain about his weights.  He has not had any substernal chest discomfort.  He did note some left lower rib discomfort with positional changes but this discomfort has resolved.  He has not had syncope.  He has received 1 dose of furosemide 20 mg IV with minimal  improvement.  Data O2 96, BP 140/96, HR 109 Na 133, K 4.3, Glucose 143, SCr 0.92, Hgb 13.6 BNP 1036 Hs-Trop 30>>34 SARS-CoV-2  neg CXR: cardiomegaly; small to mod bilat effusions; bilat mid lower lung opacities (edema or ATX) EKG: sinus tachycardia, HR 115, Inf Q waves, normal axis, PRWP, non-specific ST-TW changes, QTc 486   Past Medical History:  Diagnosis Date   Alcohol abuse    Anxiety    CAD in native artery    a. MI in 2008 treated with stenting to the left circumflex. b. post-op NSTEMI with progressive 3V disease 10/2016 - recommended for bypass after recovering from ortho surgery but patient declined.   Chronic combined systolic and diastolic CHF (congestive heart failure) (HCC)    Dyspnea    Ischemic cardiomyopathy    Mild mitral regurgitation    Myocardial infarction [facility])    July 2008   Tobacco abuse     Past Surgical History:  Procedure Laterality Date   ANKLE SURGERY     CARDIAC CATHETERIZATION     stent placed 2008   LEFT HEART CATH AND CORONARY ANGIOGRAPHY N/A 10/22/2016   Procedure: LEFT HEART CATH AND CORONARY ANGIOGRAPHY;  Surgeon: Lyn Records, MD;  Location: [facility] INVASIVE CV LAB;  Service: Cardiovascular;  Laterality: N/A;   SHOULDER SURGERY     TIBIA  IM NAIL INSERTION Left 10/21/2016   Procedure: INTRAMEDULLARY (IM) ROD TIBIA/FIBULA FRACTURE;  Surgeon: Jodi Geralds, MD;  Location: [facility] OR;  Service: Orthopedics;  Laterality: Left;   TONSILLECTOMY       Medications Prior to Admission: ASA Fish Oil MVI   Allergies:    Allergies  Allergen Reactions   Penicillins Itching    Social History:   Social History   Socioeconomic History   Marital status: Single    Spouse name: Not on file   Number of children: Not on file   Years of education: Not on file   Highest education level: Not on file  Occupational History   Not on file  Tobacco Use   Smoking status: Current Every Day Smoker    Packs/day: 1.00    Years: 40.00     Pack years: 40.00    Types: Cigarettes   Smokeless tobacco: Never Used  Vaping Use   Vaping Use: Never used  Substance and Sexual Activity   Alcohol use: Yes   Drug use: No   Sexual activity: Not on file  Other Topics Concern   Not on file  Social History Narrative   Not on file   Social Determinants of Health   Financial Resource Strain:    Difficulty of Paying Living Expenses:   Food Insecurity:    Worried About Programme researcher, broadcasting/film/video in the Last Year:    Barista in the Last Year:   Transportation Needs:    Freight forwarder (Medical):    Lack of Transportation (Non-Medical):   Physical Activity:    Days of Exercise per Week:    Minutes of Exercise per Session:   Stress:    Feeling of Stress :   Social Connections:    Frequency of Communication with Friends and Family:    Frequency of Social Gatherings with Friends and Family:    Attends Religious Services:    Active Member of Clubs or Organizations:    Attends Engineer, structural:    Marital Status:   Intimate Partner Violence:    Fear of Current or Ex-Partner:    Emotionally Abused:    Physically Abused:    Sexually Abused:     Family History:    The patient's family history includes Mitral valve prolapse in his mother and sister; Stroke in his father.    ROS:  Please see the history of present illness.  He notes a cough that is somewhat productive.  He has not had fevers.  He has had some diarrhea.  He has not had melena, hematochezia, hematuria. All other ROS reviewed and negative.     Physical Exam/Data:   Vitals:   08/21/19 1530 08/21/19 1600 08/21/19 1630 08/21/19 1633  BP: (!) 135/96 (!) 139/102 129/86   Pulse: (!) 105 (!) 109 (!) 107   Resp: (!) 25 (!) 30 (!) 27   Temp:      TempSrc:      SpO2: 93% 94% 93%   Weight:    72.3 kg  Height:    5\' 7"  (1.702 m)   No intake or output data in the 24 hours ending 08/21/19 1731 Last 3 Weights 08/21/2019  12/06/2016 11/08/2016  Weight (lbs) 159 lb 6.3 oz 159 lb 6.4 oz (No Data)  Weight (kg) 72.3 kg 72.303 kg (No Data)     Body mass index is 24.96 kg/m.  General:  Well nourished, well developed, in no acute distress HEENT: normal Lymph:  no adenopathy Neck: JVP at 7-8 cm; + HJR Endocrine:  No thryomegaly Vascular: Distal pulses intact Cardiac: Distant heart sounds, regular tachycardic rhythm, no obvious murmur Lungs: Decreased breath sounds with bibasilar rales Abd: Distended, nontender Ext: Trace bilateral ankle edema Musculoskeletal:  No deformities Skin: warm and dry  Neuro:  CNs 2-12 intact, no focal abnormalities noted Psych:  Normal affect    EKG:  The ECG that was done  was personally reviewed and demonstrates see above   Laboratory Data:  High Sensitivity Troponin:   Recent Labs  Lab 08/21/19 1022 08/21/19 1501  TROPONINIHS 30* 34*      Chemistry Recent Labs  Lab 08/21/19 1022  NA 133*  K 4.3  CL 99  CO2 20*  GLUCOSE 143*  BUN 13  CREATININE 0.92  CALCIUM 8.9  GFRNONAA >60  GFRAA >60  ANIONGAP 14     Hematology Recent Labs  Lab 08/21/19 1022  WBC 14.9*  RBC 3.82*  HGB 13.6  HCT 40.8  MCV 106.8*  MCH 35.6*  MCHC 33.3  RDW 11.9  PLT 304   BNP Recent Labs  Lab 08/21/19 1501  BNP 1,035.8*     Radiology/Studies:  DG Chest 2 View  Result Date: 08/21/2019 CLINICAL DATA:  Shortness of breath for 2-3 days. EXAM: CHEST - 2 VIEW COMPARISON:  Chest radiograph 10/22/2016. FINDINGS: Cardiac contours are enlarged. Small to moderate bilateral pleural effusions, left greater than right. Bilateral mid and lower lung airspace opacities. No pneumothorax. Thoracic spine degenerative changes. IMPRESSION: 1. Cardiomegaly. 2. Small to moderate bilateral pleural effusions and bilateral mid lower lung opacities which may represent edema and or atelectasis. Infection not excluded. Electronically Signed   By: Annia Belt M.D.   On: 08/21/2019 10:37         [place] Heart Association (NYHA) Functional Class NYHA Class IV  Assessment and Plan:   1.  Acute on chronic systolic and diastolic CHF Ischemic cardiomyopathy.  EF 20%.  NYHA IV.  He is volume overloaded and has not been on regular cardiac medications for quite some time.  He continues to smoke and drinks excessive amounts of alcohol.  He has not really noted any significant change in his dietary habits recently.  He has no insurance.  I think he would probably benefit from [facility].  It may be worth getting case management to see if we can get him approved for this.  -Admit to telemetry.    -Echocardiogram   -Furosemide 40 mg IV twice daily.   -K+ 20 mEq twice daily.    -Lisinopril 10 mg daily.      -Eventually, add beta-blocker with carvedilol and spironolactone.  2.  Coronary artery disease High-sensitivity troponins are mildly elevated but without significant delta.  Elevated troponins are consistent with demand ischemia.  He is status post non-STEMI in 2018.  Cardiac catheterization that time demonstrated three-vessel coronary disease.  He was referred for bypass but the patient declined.  He is still not interested in proceeding with bypass surgery.    -Continue aspirin  -Start Atorvastatin 80 mg once daily (may need to change to Pravastatin due to cost)  -Have interventional team review films to see if worth trying to do PCI  3. Hypertension  Start Lisinopril.  Eventually add Carvedilol, spironolactone.   4. Hyperlipidemia  Start high intensity statin as noted.  5. Tobacco abuse Recommend cessation  6. ETOH abuse He drinks a significant amount alcohol but does go days without drinking without untoward  effects.  Will monitor for symptoms of withdrawal.  Severity of Illness: The appropriate patient status for this patient is INPATIENT. Inpatient status is judged to be reasonable and necessary in order to provide the required intensity of service to ensure the patient's safety. The  patient's presenting symptoms, physical exam findings, and initial radiographic and laboratory data in the context of their chronic comorbidities is felt to place them at high risk for further clinical deterioration. Furthermore, it is not anticipated that the patient will be medically stable for discharge from the hospital within 2 midnights of admission. The following factors support the patient status of inpatient.   " The patient's presenting symptoms include shortness of breath, cough. " The worrisome physical exam findings include elevated JVP, basilar rales, distended abdomen. " The initial radiographic and laboratory data are worrisome because of elevated BNP, chest x-ray consistent with CHF. " The chronic co-morbidities include CAD, CHF, hypertension, hyperlipidemia, tobacco abuse, alcohol use.   * I certify that at the point of admission it is my clinical judgment that the patient will require inpatient hospital care spanning beyond 2 midnights from the point of admission due to high intensity of service, high risk for further deterioration and high frequency of surveillance required.*    For questions or updates, please contact [facility] Please consult [email or web address] for contact info under     Signed, Tereso NewcomerScott Weaver, PA-C  08/21/2019 5:31 PM    See separate H and P conclusion

## 2019-08-21 NOTE — ED Provider Notes (Signed)
[name] Provider Note   CSN: [id number] Arrival date & time: 08/21/19  3662     History No chief complaint on file.   [name] is a 54 y.o. male.  The history is provided by the patient.  Shortness of Breath Severity:  Moderate Onset quality:  Gradual Duration:  4 days Timing:  Intermittent Progression:  Worsening Chronicity:  Recurrent Relieved by:  Sitting up Worsened by:  Exertion (lying down) Ineffective treatments:  None tried Associated symptoms: cough   Associated symptoms: no abdominal pain, no chest pain, no ear pain, no fever, no rash, no sore throat and no vomiting        Past Medical History:  Diagnosis Date  . Alcohol abuse   . Anxiety   . CAD in native artery    a. MI in 2008 treated with stenting to the left circumflex. b. post-op NSTEMI with progressive 3V disease 10/2016 - recommended for bypass after recovering from ortho surgery but patient declined.  . Chronic combined systolic and diastolic CHF (congestive heart failure) (HCC)   . Dyspnea   . Ischemic cardiomyopathy   . Mild mitral regurgitation   . Myocardial infarction [facility])    July 2008  . Tobacco abuse     Patient Active Problem List   Diagnosis Date Noted  . Acute on chronic combined systolic and diastolic CHF (congestive heart failure) (HCC) 08/21/2019  . Dilated cardiomyopathy (HCC) 11/08/2016  . Tobacco abuse 11/08/2016  . Alcohol abuse 11/08/2016  . Hyperlipidemia 11/08/2016  . Surgery, elective   . Chronic combined systolic and diastolic heart failure (HCC)   . Elevated troponin   . CAD (coronary artery disease)   . Non-ST elevation (NSTEMI) myocardial infarction (HCC) 10/22/2016  . Displaced comminuted fracture of shaft of left tibia, initial encounter for closed fracture 10/21/2016    Past Surgical History:  Procedure Laterality Date  . ANKLE SURGERY    . CARDIAC CATHETERIZATION     stent placed 2008  . LEFT HEART CATH  AND CORONARY ANGIOGRAPHY N/A 10/22/2016   Procedure: LEFT HEART CATH AND CORONARY ANGIOGRAPHY;  Surgeon: Lyn Records, MD;  Location: [facility] INVASIVE CV LAB;  Service: Cardiovascular;  Laterality: N/A;  . SHOULDER SURGERY    . TIBIA IM NAIL INSERTION Left 10/21/2016   Procedure: INTRAMEDULLARY (IM) ROD TIBIA/FIBULA FRACTURE;  Surgeon: Jodi Geralds, MD;  Location: [facility] OR;  Service: Orthopedics;  Laterality: Left;  . TONSILLECTOMY         Family History  Problem Relation Age of Onset  . Mitral valve prolapse Mother   . Stroke Father   . Mitral valve prolapse Sister        mitral valve surgery    Social History   Tobacco Use  . Smoking status: Current Every Day Smoker    Packs/day: 1.00    Years: 40.00    Pack years: 40.00    Types: Cigarettes  . Smokeless tobacco: Never Used  Vaping Use  . Vaping Use: Never used  Substance Use Topics  . Alcohol use: Yes  . Drug use: No    Home Medications Prior to Admission medications   Medication Sig Start Date End Date Taking? Authorizing Provider  aspirin EC 81 MG EC tablet Take 1 tablet (81 mg total) by mouth daily. 10/28/16  Yes Hall, Oliver Pila, DO  Omega-3 Fatty Acids (FISH OIL PO) Take 1,000 g by mouth daily.    Yes [provider]  atorvastatin (LIPITOR)  80 MG tablet Take 1 tablet (80 mg total) by mouth daily. Patient not taking: Reported on 08/21/2019 02/04/18   Tonny Bollman, MD  bisacodyl (DULCOLAX) 5 MG EC tablet Take 1 tablet (5 mg total) by mouth daily as needed for moderate constipation. Patient not taking: Reported on 08/21/2019 10/27/16   Darlin Drop, DO  clopidogrel (PLAVIX) 75 MG tablet Take 1 tablet (75 mg total) by mouth daily. Patient not taking: Reported on 08/21/2019 02/04/18   Tonny Bollman, MD  docusate sodium (COLACE) 100 MG capsule Take 1 capsule (100 mg total) by mouth 2 (two) times daily. Patient not taking: Reported on 08/21/2019 10/27/16   Darlin Drop, DO  furosemide (LASIX) 20 MG tablet Take 1  tablet (20 mg total) by mouth daily. Please make overdue appt with Dr. Excell Seltzer before anymore refills. 3rd and Final attempt Patient not taking: Reported on 08/21/2019 01/19/18   Tonny Bollman, MD  lisinopril (PRINIVIL,ZESTRIL) 2.5 MG tablet TAKE 1 TABLET BY MOUTH EVERY DAY Patient not taking: Reported on 08/21/2019 02/04/18   Tonny Bollman, MD  metoprolol succinate (TOPROL-XL) 25 MG 24 hr tablet Take 1 tablet (25 mg total) by mouth daily. Patient not taking: Reported on 08/21/2019 02/04/18   Tonny Bollman, MD  spironolactone (ALDACTONE) 25 MG tablet Take 25 mg by mouth daily. Patient not taking: Reported on 08/21/2019    [provider]  terbinafine (LAMISIL) 1 % cream Apply topically daily. Patient not taking: Reported on 08/21/2019 10/28/16   Darlin Drop, DO    Allergies    Penicillins  Review of Systems   Review of Systems  Constitutional: Negative for chills and fever.  HENT: Negative for ear pain and sore throat.   Eyes: Negative for pain and visual disturbance.  Respiratory: Positive for cough and shortness of breath.   Cardiovascular: Negative for chest pain and palpitations.  Gastrointestinal: Negative for abdominal pain and vomiting.  Genitourinary: Negative for dysuria and hematuria.  Musculoskeletal: Negative for arthralgias and back pain.  Skin: Negative for color change and rash.  Neurological: Negative for seizures and syncope.  All other systems reviewed and are negative.   Physical Exam Updated Vital Signs BP (!) 135/93   Pulse (!) 111   Temp 98.6 F (37 C) (Oral)   Resp 20   Ht 5\' 7"  (1.702 m)   Wt 81.2 kg   SpO2 94%   BMI 28.04 kg/m   Physical Exam Vitals and nursing note reviewed.  Constitutional:      Appearance: He is well-developed.  HENT:     Head: Normocephalic and atraumatic.  Eyes:     Conjunctiva/sclera: Conjunctivae normal.  Neck:     Comments: JVD present Cardiovascular:     Rate and Rhythm: Regular rhythm. Tachycardia  present.     Heart sounds: No murmur heard.   Pulmonary:     Effort: Pulmonary effort is normal. No respiratory distress.     Breath sounds: Rales (bl) present.  Abdominal:     Palpations: Abdomen is soft.     Tenderness: There is no abdominal tenderness.  Musculoskeletal:        General: No tenderness or deformity.     Cervical back: Neck supple.     Right lower leg: Edema present.     Left lower leg: Edema present.     Comments: 1+ pitting edema bl.  Skin:    General: Skin is warm and dry.  Neurological:     General: No focal deficit present.  Mental Status: He is alert and oriented to person, place, and time. Mental status is at baseline.     ED Results / Procedures / Treatments   Labs (all labs ordered are listed, but only abnormal results are displayed) Labs Reviewed  BASIC METABOLIC PANEL - Abnormal; Notable for the following components:      Result Value   Sodium 133 (*)    CO2 20 (*)    Glucose, Bld 143 (*)    All other components within normal limits  CBC - Abnormal; Notable for the following components:   WBC 14.9 (*)    RBC 3.82 (*)    MCV 106.8 (*)    MCH 35.6 (*)    All other components within normal limits  BRAIN NATRIURETIC PEPTIDE - Abnormal; Notable for the following components:   B Natriuretic Peptide 1,035.8 (*)    All other components within normal limits  TROPONIN I (HIGH SENSITIVITY) - Abnormal; Notable for the following components:   Troponin I (High Sensitivity) 30 (*)    All other components within normal limits  TROPONIN I (HIGH SENSITIVITY) - Abnormal; Notable for the following components:   Troponin I (High Sensitivity) 34 (*)    All other components within normal limits  SARS CORONAVIRUS 2 BY RT PCR [facility]OSPITAL ORDER[facility])    EKG EKG Interpretation  Date/Time:  Saturday August 21 2019 09:24:51 EDT Ventricular Rate:  115 PR Interval:  126 QRS Duration: 108 QT Interval:  352 QTC  Calculation: 486 R Axis:   79 Text Interpretation: Sinus tachycardia with occasional Premature ventricular complexes Nonspecific T wave abnormality Abnormal ECG occasional PVC otherwise similar to previous Confirmed by Frederick PeersLittle, Rachel [phone]([id number]) on 08/21/2019 3:06:39 PM   Radiology DG Chest 2 View  Result Date: 08/21/2019 CLINICAL DATA:  Shortness of breath for 2-3 days. EXAM: CHEST - 2 VIEW COMPARISON:  Chest radiograph 10/22/2016. FINDINGS: Cardiac contours are enlarged. Small to moderate bilateral pleural effusions, left greater than right. Bilateral mid and lower lung airspace opacities. No pneumothorax. Thoracic spine degenerative changes. IMPRESSION: 1. Cardiomegaly. 2. Small to moderate bilateral pleural effusions and bilateral mid lower lung opacities which may represent edema and or atelectasis. Infection not excluded. Electronically Signed   By: Annia Beltrew  Davis M.D.   On: 08/21/2019 10:37    Procedures Ultrasound ED Echo  Date/Time: 08/21/2019 7:18 PM Performed by: Rickey PrimusWeekley, Addisson Frate, MD Authorized by: Laurence SpatesLittle, Rachel Morgan, MD   Procedure details:    Indications: dyspnea     Views: subxiphoid, parasternal long axis view, parasternal short axis view, apical 4 chamber view and IVC view     Images: archived   Findings:    Pericardium: no pericardial effusion     LV Function: severly depressed (<30%)     RV Diameter: normal     IVC: dilated   Impression:    Impression: decreased contractility     Impression comment:  Findings c/w severely depressed EF and signs of fluid overload   (including critical care time)  Medications Ordered in ED Medications  furosemide (LASIX) injection 20 mg (20 mg Intravenous Given 08/21/19 1610)    ED Course  I have reviewed the triage vital signs and the nursing notes.  Pertinent labs & imaging results that were available during my care of the patient were reviewed by me and considered in my medical decision making (see chart for details).    MDM  Rules/Calculators/A&P  54 year old male with history of HFrEF (EF 20% in 2018) presents with shortness of breath and cough.  Patient states he has been having shortness of breath for the past 4 days.  States that his shortness of breath exertional and worse when he is laying down.  Also endorses a nonproductive cough with some around the time.  Denies lower extremity edema, fever, chill, chest pain, abdominal pain, nausea, vomiting.  Patient states that he did get clammy last night and has had moderate weight gain since losing his job.  Afebrile.  Heart rate 100s to low 110s.  Remainder vital signs stable.  Exam as above.  Exam most notable for JVD as well as 1+ tibial pitting edema bilaterally.  Rales in lower lung fields bilaterally.  EKG showed normal sinus rhythm with intermittent PVCs.  Labs in triage notable for troponin of 30 and white count of 14.9.  Remainder of BMP and CBC unremarkable.  Chest x-ray showed small to moderate bilateral pleural effusions and pulmonary edema with cardiomegaly.  Unclear etiology to patient's shortness of breath.  Differential includes infectious symptoms (Covid versus pneumonia), CHF exacerbation, ACS.  Will obtain troponin as well as BNP and Covid swab patient and reassess.  Second troponin 34.  BNP 1035.  Covid negative.  Bedside ultrasound showed severely decreased EF with other signs of fluid overload.  Patient's labs and history c/w acute decompensated heart failure.  Patient was given 20 mg of IV Lasix and cardiology was consulted.  Cardiology evaluated the patient at bedside and recommended admission to their service.  This plan was discussed with the patient who voiced agreement and understanding of the overall plan.  Patient was admitted and transferred to the cardiology service in stable condition without further events.  Final Clinical Impression(s) / ED Diagnoses Final diagnoses:  Acute decompensated heart failure [facility])    Rx  / DC Orders ED Discharge Orders    None       Rickey Primus, MD 08/21/19 1923    Clarene Duke Ambrose Finland, MD 08/23/19 [phone]

## 2019-08-21 NOTE — ED Notes (Signed)
Got patient undress into a gown on the monitor patient is resting with call bell in reach 

## 2019-08-22 ENCOUNTER — Inpatient Hospital Stay (HOSPITAL_COMMUNITY): Payer: Self-pay

## 2019-08-22 DIAGNOSIS — I5043 Acute on chronic combined systolic (congestive) and diastolic (congestive) heart failure: Secondary | ICD-10-CM

## 2019-08-22 DIAGNOSIS — I5021 Acute systolic (congestive) heart failure: Secondary | ICD-10-CM

## 2019-08-22 LAB — URINALYSIS, DIPSTICK ONLY
Bilirubin Urine: NEGATIVE
Glucose, UA: NEGATIVE mg/dL
Hgb urine dipstick: NEGATIVE
Ketones, ur: NEGATIVE mg/dL
Leukocytes,Ua: NEGATIVE
Nitrite: NEGATIVE
Protein, ur: NEGATIVE mg/dL
Specific Gravity, Urine: 1.004 — ABNORMAL LOW (ref 1.005–1.030)
pH: 8 (ref 5.0–8.0)

## 2019-08-22 LAB — BASIC METABOLIC PANEL
Anion gap: 13 (ref 5–15)
BUN: 11 mg/dL (ref 6–20)
CO2: 22 mmol/L (ref 22–32)
Calcium: 8.9 mg/dL (ref 8.9–10.3)
Chloride: 100 mmol/L (ref 98–111)
Creatinine, Ser: 0.87 mg/dL (ref 0.61–1.24)
GFR calc Af Amer: >60 mL/min (ref >60)
GFR calc non Af Amer: >60 mL/min (ref >60)
Glucose, Bld: 191 mg/dL — ABNORMAL HIGH (ref 70–99)
Potassium: 4 mmol/L (ref 3.5–5.1)
Sodium: 135 mmol/L (ref 135–145)

## 2019-08-22 LAB — ECHOCARDIOGRAM COMPLETE
Area-P 1/2: 5.27 cm2
Calc EF: 17.2 %
Height: 67 in
S' Lateral: 5.7 cm
Single Plane A2C EF: 13.5 %
Single Plane A4C EF: 16.6 %
Weight: 2864 oz

## 2019-08-22 LAB — LIPID PANEL
Cholesterol: 144 mg/dL (ref 0–200)
HDL: 35 mg/dL — ABNORMAL LOW (ref >40)
LDL Cholesterol: 93 mg/dL (ref 0–99)
Total CHOL/HDL Ratio: 4.1 RATIO
Triglycerides: 78 mg/dL (ref <150)
VLDL: 16 mg/dL (ref 0–40)

## 2019-08-22 MED ORDER — PERFLUTREN LIPID MICROSPHERE
1.0000 mL | INTRAVENOUS | Status: AC | PRN
  Administered 2019-08-22: 2 mL via INTRAVENOUS
  Filled 2019-08-22: qty 10

## 2019-08-22 NOTE — Progress Notes (Signed)
°  Echocardiogram 2D Echocardiogram has been performed.  [name] 08/22/2019, 10:26 AM

## 2019-08-22 NOTE — Progress Notes (Addendum)
Progress Note  Patient Name: [name] Date of Encounter: 08/22/2019  [facility] Cardiologist: Tonny Bollman, MD   Patient Profile     53 y.o. male w known severe 3 V CAD, declined CABG a few years ago presenting with A/C CHF with dyspnea and volume overload - 1.5 L overnight  Subjective   Breathing better, no chest pain Not interested in CABG  Inpatient Medications    Scheduled Meds: . aspirin EC  81 mg Oral Daily  . atorvastatin  80 mg Oral Daily  . enoxaparin (LOVENOX) injection  40 mg Subcutaneous Q24H  . furosemide  40 mg Intravenous BID  . lisinopril  10 mg Oral Daily  . potassium chloride  20 mEq Oral BID  . sodium chloride flush  3 mL Intravenous Q12H   Continuous Infusions: . sodium chloride     PRN Meds: sodium chloride, acetaminophen, ondansetron (ZOFRAN) IV, sodium chloride flush   Vital Signs    Vitals:   08/21/19 1917 08/22/19 0020 08/22/19 0451 08/22/19 0746  BP: (!) 130/100 111/75 106/78 117/89  Pulse: (!) 113 (!) 101  96  Resp: 20 18 (!) 22 20  Temp: 98.6 F (37 C) 98.6 F (37 C) 98 F (36.7 C) 98.3 F (36.8 C)  TempSrc: Oral Oral Oral Oral  SpO2: 94% 95%  98%  Weight: 81.2 kg  81.2 kg   Height:        Intake/Output Summary (Last 24 hours) at 08/22/2019 0957 Last data filed at 08/22/2019 0800 Gross per 24 hour  Intake --  Output 1750 ml  Net -1750 ml   Last 3 Weights 08/22/2019 08/21/2019 08/21/2019  Weight (lbs) 179 lb 179 lb 159 lb 6.3 oz  Weight (kg) 81.194 kg 81.194 kg 72.3 kg      Telemetry    Sinus tach  Tel VT NS - Personally Reviewed  ECG       Physical Exam   GEN: No acute distress.   Neck: JVP 8 Cardiac: RRR, no murmurs, rubs, or gallops.  Respiratory: Clear to auscultation bilaterally. GI: Soft, nontender, non-distended  MS: tr edema; No deformity. Neuro:  Nonfocal  Psych: Normal affect   Labs    High Sensitivity Troponin:   Recent Labs  Lab 08/21/19 1022 08/21/19 1501  TROPONINIHS 30* 34*       Chemistry Recent Labs  Lab 08/21/19 1022 08/21/19 2030  NA 133*  --   K 4.3  --   CL 99  --   CO2 20*  --   GLUCOSE 143*  --   BUN 13  --   CREATININE 0.92 0.79  CALCIUM 8.9  --   PROT  --  6.5  ALBUMIN  --  3.0*  AST  --  91*  ALT  --  126*  ALKPHOS  --  112  BILITOT  --  1.2  GFRNONAA >60 >60  GFRAA >60 >60  ANIONGAP 14  --      Hematology Recent Labs  Lab 08/21/19 1022 08/21/19 2145  WBC 14.9* 13.9*  RBC 3.82* 3.87*  HGB 13.6 13.5  HCT 40.8 39.6  MCV 106.8* 102.3*  MCH 35.6* 34.9*  MCHC 33.3 34.1  RDW 11.9 11.9  PLT 304 315    BNP Recent Labs  Lab 08/21/19 1501  BNP 1,035.8*     DDimer No results for input(s): DDIMER in the last 168 hours.   Radiology    DG Chest 2 View  Result Date: 08/21/2019 CLINICAL DATA:  Shortness of breath for 2-3 days. EXAM: CHEST - 2 VIEW COMPARISON:  Chest radiograph 10/22/2016. FINDINGS: Cardiac contours are enlarged. Small to moderate bilateral pleural effusions, left greater than right. Bilateral mid and lower lung airspace opacities. No pneumothorax. Thoracic spine degenerative changes. IMPRESSION: 1. Cardiomegaly. 2. Small to moderate bilateral pleural effusions and bilateral mid lower lung opacities which may represent edema and or atelectasis. Infection not excluded. Electronically Signed   By: Annia Belt M.D.   On: 08/21/2019 10:37    Cardiac Studies  Echo EF 10-15%   Assessment & Plan      Acute/chronic congestive heart failure-systolic  Ischemic cardiomyopathy with nonrevascularized (recommended CABG)  Alcohol abuse (greater than 5-10 ounces per day)  Sinus Tachycardia ? Cause ( Hgb and TSH normal) so I suspect reactive to CHF   Hypoalbuminemia    Pt remains averse to the idea of CABG; hence will not pursue cath and continue efforts to reestablish medical therapy Not decompensated at this time, so will begin low dose carvedilol prob in am And begin aldactone today  Continue IV lasix until  Cr bumps  Will need close followup  As applied for medicaid, and will send a chat to [place] B from [facility] to see what we can do to facilitate compliance  Watch for ETOH withdrawal   Hypoalbuminemia ( to this not very thoughtful guy) suspect is 2/2 ETOH intake and dietary lack  But will check UA to make sure no proteinuria       For questions or updates, please contact [facility] Please consult [email or web address] for contact info under        Signed, Sherryl Manges, MD  08/22/2019, 9:57 AM

## 2019-08-23 LAB — BASIC METABOLIC PANEL
Anion gap: 11 (ref 5–15)
BUN: 12 mg/dL (ref 6–20)
CO2: 26 mmol/L (ref 22–32)
Calcium: 9.6 mg/dL (ref 8.9–10.3)
Chloride: 99 mmol/L (ref 98–111)
Creatinine, Ser: 0.87 mg/dL (ref 0.61–1.24)
GFR calc Af Amer: >60 mL/min (ref >60)
GFR calc non Af Amer: >60 mL/min (ref >60)
Glucose, Bld: 108 mg/dL — ABNORMAL HIGH (ref 70–99)
Potassium: 4 mmol/L (ref 3.5–5.1)
Sodium: 136 mmol/L (ref 135–145)

## 2019-08-23 LAB — MAGNESIUM: Magnesium: 2.2 mg/dL (ref 1.7–2.4)

## 2019-08-23 MED ORDER — LORAZEPAM 1 MG PO TABS: 0.0000 mg | ORAL_TABLET | Freq: Two times a day (BID) | ORAL | Status: DC

## 2019-08-23 MED ORDER — THIAMINE HCL 100 MG PO TABS
100.0000 mg | ORAL_TABLET | Freq: Every day | ORAL | Status: DC
  Administered 2019-08-23 – 2019-08-24 (×2): 100 mg via ORAL
  Filled 2019-08-23 (×2): qty 1

## 2019-08-23 MED ORDER — LORAZEPAM 1 MG PO TABS: 0.0000 mg | ORAL_TABLET | Freq: Four times a day (QID) | ORAL | Status: DC

## 2019-08-23 MED ORDER — ADULT MULTIVITAMIN W/MINERALS CH
1.0000 | ORAL_TABLET | Freq: Every day | ORAL | Status: DC
  Administered 2019-08-23 – 2019-08-24 (×2): 1 via ORAL
  Filled 2019-08-23 (×2): qty 1

## 2019-08-23 MED ORDER — LOSARTAN POTASSIUM 25 MG PO TABS
12.5000 mg | ORAL_TABLET | Freq: Every day | ORAL | Status: DC
  Administered 2019-08-23 – 2019-08-24 (×2): 12.5 mg via ORAL
  Filled 2019-08-23 (×2): qty 1

## 2019-08-23 MED ORDER — LORAZEPAM 1 MG PO TABS: 1.0000 mg | ORAL_TABLET | ORAL | Status: DC | PRN

## 2019-08-23 MED ORDER — LORAZEPAM 2 MG/ML IJ SOLN: 1.0000 mg | INTRAMUSCULAR | Status: DC | PRN

## 2019-08-23 MED ORDER — SPIRONOLACTONE 12.5 MG HALF TABLET
12.5000 mg | ORAL_TABLET | Freq: Every day | ORAL | Status: DC
  Administered 2019-08-23: 12.5 mg via ORAL
  Filled 2019-08-23: qty 1

## 2019-08-23 MED ORDER — THIAMINE HCL 100 MG/ML IJ SOLN
100.0000 mg | Freq: Every day | INTRAMUSCULAR | Status: DC
  Filled 2019-08-23: qty 2

## 2019-08-23 MED ORDER — LIVING BETTER WITH HEART FAILURE BOOK: Freq: Once | Status: AC

## 2019-08-23 MED ORDER — FOLIC ACID 1 MG PO TABS
1.0000 mg | ORAL_TABLET | Freq: Every day | ORAL | Status: DC
  Administered 2019-08-23 – 2019-08-24 (×2): 1 mg via ORAL
  Filled 2019-08-23 (×2): qty 1

## 2019-08-23 NOTE — Progress Notes (Signed)
   08/23/19 0821  Assess: MEWS Score  Temp 98.4 F (36.9 C)  BP 94/74  ECG Heart Rate (!) 102  Resp 18  Level of Consciousness Alert  SpO2 97 %  O2 Device Room Air  Assess: MEWS Score  MEWS Temp 0  MEWS Systolic 1  MEWS Pulse 1  MEWS RR 0  MEWS LOC 0  MEWS Score 2  MEWS Score Color Yellow  Assess: if the MEWS score is Yellow or Red  Were vital signs taken at a resting state? Yes  Focused Assessment No change from prior assessment  Early Detection of Sepsis Score *See Row Information* Low  MEWS guidelines implemented *See Row Information* Yes  Treat  MEWS Interventions Escalated (See documentation below)  Escalate  MEWS: Escalate Yellow: discuss with charge nurse/RN and consider discussing with provider and RRT  Notify: Charge Nurse/RN  Name of Charge Nurse/RN Notified Chrissy Earlene Plater  Date Charge Nurse/RN Notified 08/23/19  Time Charge Nurse/RN Notified 5300  Notify: Provider  Provider Name/Title Ronie Spies  Date Provider Notified 08/23/19  Time Provider Notified [phone]  Notification Type Face-to-face  Notification Reason Other (Comment) (morning rounds)  Response Other (Comment) (orders to hold lisinopril at this time for soft BP)  Date of Provider Response 08/23/19  Time of Provider Response [phone]  Document  Patient Outcome Other (Comment) (stable & asymptomatic; provider aware of vital signs)  Progress note created (see row info) Yes

## 2019-08-23 NOTE — Progress Notes (Addendum)
Progress Note  Patient Name: Eduardo [name] Date of Encounter: 08/23/2019  Primary Cardiologist: Tonny Bollman, MD  Subjective   Feeling somewhat better, denies CP or SOB. Abdomen less swollen, but still not back to baseline. Declined labwork this AM - was unhappy with them coming in to draw early this AM and difficulty obtaining access.  Inpatient Medications    Scheduled Meds: . aspirin EC  81 mg Oral Daily  . atorvastatin  80 mg Oral Daily  . enoxaparin (LOVENOX) injection  40 mg Subcutaneous Q24H  . furosemide  40 mg Intravenous BID  . lisinopril  10 mg Oral Daily  . potassium chloride  20 mEq Oral BID  . sodium chloride flush  3 mL Intravenous Q12H   Continuous Infusions: . sodium chloride     PRN Meds: sodium chloride, acetaminophen, ondansetron (ZOFRAN) IV, sodium chloride flush   Vital Signs    Vitals:   08/22/19 1724 08/22/19 1933 08/23/19 0403 08/23/19 0411  BP: 122/79 103/83 92/71 111/83  Pulse: (!) 102 (!) 105  (!) 104  Resp: 18 18 18 18   Temp: 98.2 F (36.8 C) 98.6 F (37 C) 98.6 F (37 C) 97.7 F (36.5 C)  TempSrc: Oral Oral Oral Oral  SpO2: 93%   96%  Weight:   81.2 kg   Height:        Intake/Output Summary (Last 24 hours) at 08/23/2019 0758 Last data filed at 08/23/2019 0403 Gross per 24 hour  Intake 720 ml  Output 3000 ml  Net -2280 ml   Last 3 Weights 08/23/2019 08/22/2019 08/21/2019  Weight (lbs) 179 lb 0.2 oz 179 lb 179 lb  Weight (kg) 81.2 kg 81.194 kg 81.194 kg     Telemetry    NSR/sinus tach with occasional PVCs and brief couplets/triplets  - Personally Reviewed  Physical Exam   GEN: No acute distress.  HEENT: Normocephalic, atraumatic, sclera non-icteric. Neck: No JVD or bruits. Cardiac: RRR no murmurs, rubs, or gallops.  Radials/DP/PT 1+ and equal bilaterally.  Respiratory: Diminished at bases bilaterally L>R. Breathing is unlabored. GI: Soft, nontender, non-distended, BS +x 4. MS: no deformity. Extremities: No clubbing  or cyanosis. No edema. Distal pedal pulses are 2+ and equal bilaterally. Neuro:  AAOx3. Follows commands. Psych:  Responds to questions appropriately with a normal affect.  Labs    High Sensitivity Troponin:   Recent Labs  Lab 08/21/19 1022 08/21/19 1501  TROPONINIHS 30* 34*      Cardiac EnzymesNo results for input(s): TROPONINI in the last 168 hours. No results for input(s): TROPIPOC in the last 168 hours.   Chemistry Recent Labs  Lab 08/21/19 1022 08/21/19 2030 08/22/19 0921  NA 133*  --  135  K 4.3  --  4.0  CL 99  --  100  CO2 20*  --  22  GLUCOSE 143*  --  191*  BUN 13  --  11  CREATININE 0.92 0.79 0.87  CALCIUM 8.9  --  8.9  PROT  --  6.5  --   ALBUMIN  --  3.0*  --   AST  --  91*  --   ALT  --  126*  --   ALKPHOS  --  112  --   BILITOT  --  1.2  --   GFRNONAA >60 >60 >60  GFRAA >60 >60 >60  ANIONGAP 14  --  13     Hematology Recent Labs  Lab 08/21/19 1022 08/21/19 2145  WBC 14.9* 13.9*  RBC 3.82* 3.87*  HGB 13.6 13.5  HCT 40.8 39.6  MCV 106.8* 102.3*  MCH 35.6* 34.9*  MCHC 33.3 34.1  RDW 11.9 11.9  PLT 304 315    BNP Recent Labs  Lab 08/21/19 1501  BNP 1,035.8*     DDimer No results for input(s): DDIMER in the last 168 hours.   Radiology    DG Chest 2 View  Result Date: 08/21/2019 CLINICAL DATA:  Shortness of breath for 2-3 days. EXAM: CHEST - 2 VIEW COMPARISON:  Chest radiograph 10/22/2016. FINDINGS: Cardiac contours are enlarged. Small to moderate bilateral pleural effusions, left greater than right. Bilateral mid and lower lung airspace opacities. No pneumothorax. Thoracic spine degenerative changes. IMPRESSION: 1. Cardiomegaly. 2. Small to moderate bilateral pleural effusions and bilateral mid lower lung opacities which may represent edema and or atelectasis. Infection not excluded. Electronically Signed   By: Annia Beltrew  Davis M.D.   On: 08/21/2019 10:37   ECHOCARDIOGRAM COMPLETE  Result Date: 08/22/2019    ECHOCARDIOGRAM REPORT    Patient Name:   Eduardo [name] Date of Exam: 08/22/2019 Medical Rec #:  [id number]        Height:       67.0 in Accession #:    [id number]       Weight:       179.0 lb Date of Birth:  09/01/1965         BSA:          1.929 m Patient Age:    54 years         BP:           106/78 mmHg Patient Gender: M                HR:           101 bpm. Exam Location:  Inpatient Procedure: 2D Echo, Cardiac Doppler, Color Doppler and Intracardiac            Opacification Agent Indications:    CHF-Acute Systolic 428.21 / I50.21  History:        Patient has prior history of Echocardiogram examinations, most                 recent 10/22/2016. CHF and Cardiomyopathy, CAD and Previous                 Myocardial Infarction; Risk Factors:Current Smoker.  Sonographer:    Renella CunasJulia Swaim RDCS Referring Phys: [id number] Evern BioSCOTT T WEAVER IMPRESSIONS  1. Left ventricular ejection fraction, by estimation, is 10-15%. The left ventricle has severely decreased function. The left ventricle demonstrates global hypokinesis. The left ventricular internal cavity size was mildly to moderately dilated. Left ventricular diastolic parameters are consistent with Grade II diastolic dysfunction (pseudonormalization). Elevated left atrial pressure.  2. Right ventricular systolic function is mildly reduced. The right ventricular size is normal. Tricuspid regurgitation signal is inadequate for assessing PA pressure.  3. The mitral valve is grossly normal. Mild mitral valve regurgitation. No evidence of mitral stenosis.  4. The aortic valve is tricuspid. Aortic valve regurgitation is not visualized. No aortic stenosis is present.  5. The inferior vena cava is normal in size with <50% respiratory variability, suggesting right atrial pressure of 8 mmHg. Comparison(s): Changes from prior study are noted. EF now 10-15%. FINDINGS  Left Ventricle: Left ventricular ejection fraction, by estimation, is 10-15%. The left ventricle has severely decreased function. The left ventricle  demonstrates global hypokinesis. Definity contrast agent was given IV to delineate the  left ventricular endocardial borders. The left ventricular internal cavity size was mildly to moderately dilated. There is no left ventricular hypertrophy. Left ventricular diastolic parameters are consistent with Grade II diastolic dysfunction (pseudonormalization). Elevated left atrial pressure. Right Ventricle: The right ventricular size is normal. No increase in right ventricular wall thickness. Right ventricular systolic function is mildly reduced. Tricuspid regurgitation signal is inadequate for assessing PA pressure. Left Atrium: Left atrial size was normal in size. Right Atrium: Right atrial size was normal in size. Pericardium: Trivial pericardial effusion is present. Mitral Valve: The mitral valve is grossly normal. Mild mitral annular calcification. Mild mitral valve regurgitation. No evidence of mitral valve stenosis. Tricuspid Valve: The tricuspid valve is grossly normal. Tricuspid valve regurgitation is trivial. No evidence of tricuspid stenosis. Aortic Valve: The aortic valve is tricuspid. Aortic valve regurgitation is not visualized. No aortic stenosis is present. Pulmonic Valve: The pulmonic valve was grossly normal. Pulmonic valve regurgitation is not visualized. No evidence of pulmonic stenosis. Aorta: The aortic root is normal in size and structure. Venous: The inferior vena cava is normal in size with less than 50% respiratory variability, suggesting right atrial pressure of 8 mmHg. IAS/Shunts: The atrial septum is grossly normal. Additional Comments: There is a small pleural effusion in the left lateral region.  LEFT VENTRICLE PLAX 2D LVIDd:         6.20 cm      Diastology LVIDs:         5.70 cm      LV e' lateral:   5.33 cm/s LV PW:         0.70 cm      LV E/e' lateral: 13.6 LV IVS:        0.70 cm      LV e' medial:    3.30 cm/s LVOT diam:     2.10 cm      LV E/e' medial:  21.9 LV SV:         32 LV SV Index:    17 LVOT Area:     3.46 cm  LV Volumes (MOD) LV vol d, MOD A2C: 281.0 ml LV vol d, MOD A4C: 235.0 ml LV vol s, MOD A2C: 243.0 ml LV vol s, MOD A4C: 196.0 ml LV SV MOD A2C:     38.0 ml LV SV MOD A4C:     235.0 ml LV SV MOD BP:      45.6 ml RIGHT VENTRICLE RV S prime:     6.15 cm/s TAPSE (M-mode): 1.2 cm LEFT ATRIUM           Index       RIGHT ATRIUM           Index LA diam:      4.10 cm 2.13 cm/m  RA Area:     14.10 cm LA Vol (A2C): 62.9 ml 32.61 ml/m RA Volume:   35.70 ml  18.51 ml/m LA Vol (A4C): 36.9 ml 19.13 ml/m  AORTIC VALVE LVOT Vmax:   65.40 cm/s LVOT Vmean:  43.000 cm/s LVOT VTI:    0.093 m  AORTA Ao Root diam: 2.70 cm MITRAL VALVE MV Area (PHT): 5.27 cm    SHUNTS MV Decel Time: 144 msec    Systemic VTI:  0.09 m MV E velocity: 72.40 cm/s  Systemic Diam: 2.10 cm MV A velocity: 51.40 cm/s MV E/A ratio:  1.41 Lennie Odor MD Electronically signed by Lennie Odor MD Signature Date/Time: 08/22/2019/11:03:59 AM    Final  Cardiac Studies   2D echo 08/22/19  1. Left ventricular ejection fraction, by estimation, is 10-15%. The left  ventricle has severely decreased function. The left ventricle demonstrates  global hypokinesis. The left ventricular internal cavity size was mildly  to moderately dilated. Left  ventricular diastolic parameters are consistent with Grade II diastolic  dysfunction (pseudonormalization). Elevated left atrial pressure.  2. Right ventricular systolic function is mildly reduced. The right  ventricular size is normal. Tricuspid regurgitation signal is inadequate  for assessing PA pressure.  3. The mitral valve is grossly normal. Mild mitral valve regurgitation.  No evidence of mitral stenosis.  4. The aortic valve is tricuspid. Aortic valve regurgitation is not  visualized. No aortic stenosis is present.  5. The inferior vena cava is normal in size with <50% respiratory  variability, suggesting right atrial pressure of 8 mmHg.   Comparison(s): Changes from  prior study are noted. EF now 10-15%.   Patient Profile     54 y.o. 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 He Eduardo [name] was lost to follow-up after November 2018.  He has been on disability and has no insurance. He has only been taking aspirin, fish oil and multivitamins. He smokes about a pack and a half of cigarettes per day and drinks about 4 alcoholic drinks (Seagrams and coke) per day. He presented to [facility] with worsening SOB, orthopnea, DOE, PND, abdominal distention. Presentation felt c/w a/c combined heart failure.  Assessment & Plan    1. Acute on chronic combined CHF/ICM - LVEF progressively reduced to severe cardiomyopathy, likely due to continued CAD, medication noncompliance, ongoing ETOH abuse - admit weight 179, not acutely changed but -3.8L - he has no idea what dry weight is but was 159lb in 2018 so suspect still volume overloaded (still with probable left pleural effusion on exam) - blood pressure is borderline this AM prior to Lasix so will hold lisinopril for now and continue IV diuresis as tolerated - hold off on BB with MD given low blood pressure and severely low EF/decompensated CHF - clinical scenario remains challenging as patient is refusing labs today - I stressed the importance of daily monitoring and severe nature of his disease  2. CAD s/p prior PCI - troponins low/flat not suggestive of ACS - resumed on ASA, statin - as patient has previously and continued to decline idea of CABG, no specific indication to revisit cath at present time  3. Essential HTN - managed in context of the above  4. Hyperlipidemia - restarted on atorvastatin this admission, need to follow LFTs given ETOH and abnormality on admission  5. Tobacco/ETOH abuse - will order CIWA protocol with transitions of care  substance abuse consult - cessation advised  6. Hypoalbuminemia - suspect nutritional/ETOH - UA without proteinuria  7. PVCs - unfortunately refusing bloodwork this AM - hold course, recheck BMET/Mg when patient is willing  For questions or updates, please contact [facility] Please consult [email or web address] for contact info under Cardiology/STEMI.  Signed, Laurann Montana, PA-C 08/23/2019, 7:58 AM    Patient seen, examined. Available data reviewed. Agree with findings, assessment, and plan as outlined by Ronie Spies, PA-C.  The patient is independently interviewed and examined.  He is alert, oriented, in no distress.  JVP is normal, lung fields are diminished in the bases but otherwise clear, heart  is regular rate and rhythm with no S3 and no murmur.  Abdomen is soft and nontender with no organomegaly extremities have no edema.  The patient has very severe LV systolic dysfunction.  Blood pressure over the last 12 hours is ranging 94-111/74-83.  Will give him a trial of spironolactone 12.5 mg daily and losartan 12.5 mg daily.  Consider addition of low-dose carvedilol as an outpatient.  The patient is diuresing well.  He is feeling much better and is quite eager to go home.  I am going to repeat a portable chest x-ray tomorrow and we can consider discharging him home tomorrow depending on his clinical course.  The importance of alcohol cessation is reviewed with the patient today as I'm sure it is contributed to his severe cardiomyopathy.  Tonny Bollman, M.D. 08/23/2019 2:14 PM

## 2019-08-24 ENCOUNTER — Telehealth: Payer: Self-pay | Admitting: Physician Assistant

## 2019-08-24 DIAGNOSIS — E8809 Other disorders of plasma-protein metabolism, not elsewhere classified: Secondary | ICD-10-CM

## 2019-08-24 DIAGNOSIS — I493 Ventricular premature depolarization: Secondary | ICD-10-CM

## 2019-08-24 LAB — HEPATIC FUNCTION PANEL
ALT: 59 U/L — ABNORMAL HIGH (ref 0–44)
AST: 32 U/L (ref 15–41)
Albumin: 3 g/dL — ABNORMAL LOW (ref 3.5–5.0)
Alkaline Phosphatase: 89 U/L (ref 38–126)
Bilirubin, Direct: 0.1 mg/dL (ref 0.0–0.2)
Indirect Bilirubin: 0.5 mg/dL (ref 0.3–0.9)
Total Bilirubin: 0.6 mg/dL (ref 0.3–1.2)
Total Protein: 6.7 g/dL (ref 6.5–8.1)

## 2019-08-24 LAB — BASIC METABOLIC PANEL
Anion gap: 10 (ref 5–15)
BUN: 15 mg/dL (ref 6–20)
CO2: 27 mmol/L (ref 22–32)
Calcium: 9.5 mg/dL (ref 8.9–10.3)
Chloride: 98 mmol/L (ref 98–111)
Creatinine, Ser: 1.04 mg/dL (ref 0.61–1.24)
GFR calc Af Amer: >60 mL/min (ref >60)
GFR calc non Af Amer: >60 mL/min (ref >60)
Glucose, Bld: 168 mg/dL — ABNORMAL HIGH (ref 70–99)
Potassium: 3.7 mmol/L (ref 3.5–5.1)
Sodium: 135 mmol/L (ref 135–145)

## 2019-08-24 LAB — MAGNESIUM: Magnesium: 2 mg/dL (ref 1.7–2.4)

## 2019-08-24 LAB — HEMOGLOBIN A1C
Hgb A1c MFr Bld: 5.2 % (ref 4.8–5.6)
Mean Plasma Glucose: 102.54 mg/dL

## 2019-08-24 MED ORDER — THIAMINE HCL 100 MG PO TABS: 100.0000 mg | ORAL_TABLET | Freq: Every day | ORAL | 3 refills | Status: AC

## 2019-08-24 MED ORDER — ADULT MULTIVITAMIN W/MINERALS CH: 1.0000 | ORAL_TABLET | Freq: Every day | ORAL | Status: AC

## 2019-08-24 MED ORDER — ASPIRIN 81 MG PO TBEC: 81.0000 mg | DELAYED_RELEASE_TABLET | Freq: Every day | ORAL | 6 refills | Status: AC

## 2019-08-24 MED ORDER — ATORVASTATIN CALCIUM 80 MG PO TABS: 80.0000 mg | ORAL_TABLET | Freq: Every day | ORAL | 6 refills | Status: AC

## 2019-08-24 MED ORDER — POTASSIUM CHLORIDE CRYS ER 10 MEQ PO TBCR: 10.0000 meq | EXTENDED_RELEASE_TABLET | Freq: Every day | ORAL | 6 refills | Status: AC

## 2019-08-24 MED ORDER — POTASSIUM CHLORIDE CRYS ER 10 MEQ PO TBCR: 10.0000 meq | EXTENDED_RELEASE_TABLET | Freq: Every day | ORAL | Status: DC

## 2019-08-24 MED ORDER — FUROSEMIDE 40 MG PO TABS: 40.0000 mg | ORAL_TABLET | Freq: Every day | ORAL | Status: DC

## 2019-08-24 MED ORDER — FUROSEMIDE 40 MG PO TABS: 40.0000 mg | ORAL_TABLET | Freq: Every day | ORAL | 6 refills | Status: AC

## 2019-08-24 MED ORDER — LOSARTAN POTASSIUM 25 MG PO TABS: 12.5000 mg | ORAL_TABLET | Freq: Every day | ORAL | 6 refills | Status: AC

## 2019-08-24 MED ORDER — FOLIC ACID 1 MG PO TABS: 1.0000 mg | ORAL_TABLET | Freq: Every day | ORAL | 3 refills | Status: DC

## 2019-08-24 MED FILL — FOLIC ACID 1 MG TABS: 1 | 30 days supply | Qty: 30 | Fill #0

## 2019-08-24 MED FILL — ASPIRIN LOW DOSE 81 MG TBEC: 81 | 30 days supply | Qty: 30 | Fill #0

## 2019-08-24 MED FILL — VITAMIN B-1 100 MG TABS: 100 | 30 days supply | Qty: 30 | Fill #0

## 2019-08-24 MED FILL — FUROSEMIDE 40 MG TABLET: 40 | 30 days supply | Qty: 30 | Fill #0

## 2019-08-24 MED FILL — POTASSIUM CHL ER M10 TABLET: 10 | 30 days supply | Qty: 30 | Fill #0

## 2019-08-24 MED FILL — LOSARTAN POTASSIUM 25 MG TA: 25 | 15 days supply | Qty: 15 | Fill #0

## 2019-08-24 MED FILL — ATORVASTATIN CALCIUM 80 MG: 80 | 30 days supply | Qty: 30 | Fill #0

## 2019-08-24 NOTE — Progress Notes (Signed)
All discharge information including medication, prescription medication and  follow-up provided to patient. IV removed and all belongings returned to patient. No further question ask. Patient discharged

## 2019-08-24 NOTE — Plan of Care (Signed)
  Problem: Health Behavior/Discharge Planning: Goal: Ability to manage health-related needs will improve Outcome: Completed/Met   Problem: Education: Goal: Knowledge of General Education information will improve Description: Including pain rating scale, medication(s)/side effects and non-pharmacologic comfort measures Outcome: Completed/Met   Problem: Clinical Measurements: Goal: Ability to maintain clinical measurements within normal limits will improve Outcome: Completed/Met   Problem: Clinical Measurements: Goal: Will remain free from infection Outcome: Completed/Met   Problem: Clinical Measurements: Goal: Diagnostic test results will improve Outcome: Completed/Met   Problem: Clinical Measurements: Goal: Respiratory complications will improve Outcome: Completed/Met   Problem: Clinical Measurements: Goal: Cardiovascular complication will be avoided Outcome: Completed/Met   Problem: Activity: Goal: Risk for activity intolerance will decrease Outcome: Completed/Met   Problem: Nutrition: Goal: Adequate nutrition will be maintained Outcome: Completed/Met   Problem: Coping: Goal: Level of anxiety will decrease Outcome: Completed/Met   Problem: Elimination: Goal: Will not experience complications related to bowel motility Outcome: Completed/Met   Problem: Elimination: Goal: Will not experience complications related to urinary retention Outcome: Completed/Met   Problem: Pain Managment: Goal: General experience of comfort will improve Outcome: Completed/Met   Problem: Safety: Goal: Ability to remain free from injury will improve Outcome: Completed/Met   Problem: Safety: Goal: Ability to remain free from injury will improve Outcome: Completed/Met   Problem: Skin Integrity: Goal: Risk for impaired skin integrity will decrease Outcome: Completed/Met

## 2019-08-24 NOTE — Progress Notes (Addendum)
Progress Note  Patient Name: Eduardo [name] Date of Encounter: 08/24/2019  Primary Cardiologist: Tonny Bollman, MD  Subjective   Feeling well, ambulated without difficulty. Declined labs again this AM because it was too early.  Inpatient Medications    Scheduled Meds: . aspirin EC  81 mg Oral Daily  . atorvastatin  80 mg Oral Daily  . enoxaparin (LOVENOX) injection  40 mg Subcutaneous Q24H  . folic acid  1 mg Oral Daily  . furosemide  40 mg Intravenous BID  . LORazepam  0-4 mg Oral Q6H   Followed by  . [START ON 08/25/2019] LORazepam  0-4 mg Oral Q12H  . losartan  12.5 mg Oral Daily  . multivitamin with minerals  1 tablet Oral Daily  . potassium chloride  20 mEq Oral BID  . sodium chloride flush  3 mL Intravenous Q12H  . spironolactone  12.5 mg Oral Daily  . thiamine  100 mg Oral Daily   Or  . thiamine  100 mg Intravenous Daily   Continuous Infusions: . sodium chloride     PRN Meds: sodium chloride, acetaminophen, LORazepam **OR** LORazepam, ondansetron (ZOFRAN) IV, sodium chloride flush   Vital Signs    Vitals:   08/23/19 1454 08/23/19 1932 08/24/19 0008 08/24/19 0422  BP: 115/87 103/76 108/88 102/77  Pulse: (!) 103 (!) 104 (!) 102 92  Resp: Temp: 97.6 F (36.4 C) 98.6 F (37 C) 98.6 F (37 C) 98.6 F (37 C)  TempSrc: Oral Oral Oral Oral  SpO2: 92% 98% 97% 98%  Weight:    81.2 kg  Height:        Intake/Output Summary (Last 24 hours) at 08/24/2019 0715 Last data filed at 08/24/2019 0423 Gross per 24 hour  Intake 840 ml  Output 2325 ml  Net -1485 ml   Last 3 Weights 08/24/2019 08/23/2019 08/22/2019  Weight (lbs) 179 lb 0.2 oz 179 lb 0.2 oz 179 lb  Weight (kg) 81.2 kg 81.2 kg 81.194 kg     Telemetry    NSR/sinus tach occasional PVCs, rare couplets/triplets - Personally Reviewed  Physical Exam   GEN: No acute distress.  HEENT: Normocephalic, atraumatic, sclera non-icteric. Neck: No JVD or bruits. Cardiac: RRR no murmurs, rubs, or  gallops.  Radials/DP/PT 1+ and equal bilaterally.  Respiratory: Clear to auscultation bilaterally. Breathing is unlabored. GI: Soft, nontender, non-distended, BS +x 4. MS: no deformity. Extremities: No clubbing or cyanosis. No edema. Distal pedal pulses are 2+ and equal bilaterally. Neuro:  AAOx3. Follows commands. Psych:  Responds to questions appropriately with a normal affect.  Labs    High Sensitivity Troponin:   Recent Labs  Lab 08/21/19 1022 08/21/19 1501  TROPONINIHS 30* 34*      Cardiac EnzymesNo results for input(s): TROPONINI in the last 168 hours. No results for input(s): TROPIPOC in the last 168 hours.   Chemistry Recent Labs  Lab 08/21/19 1022 08/21/19 1022 08/21/19 2030 08/22/19 0921 08/23/19 0917  NA 133*  --   --  135 136  K 4.3  --   --  4.0 4.0  CL 99  --   --  100 99  CO2 20*  --   --  22 26  GLUCOSE 143*  --   --  191* 108*  BUN 13  --   --  11 12  CREATININE 0.92   < > 0.79 0.87 0.87  CALCIUM 8.9  --   --  8.9 9.6  PROT  --   --  6.5  --   --   ALBUMIN  --   --  3.0*  --   --   AST  --   --  91*  --   --   ALT  --   --  126*  --   --   ALKPHOS  --   --  112  --   --   BILITOT  --   --  1.2  --   --   GFRNONAA >60   < > >60 >60 >60  GFRAA >60   < > >60 >60 >60  ANIONGAP 14  --   --  13 11   < > = values in this interval not displayed.     Hematology Recent Labs  Lab 08/21/19 1022 08/21/19 2145  WBC 14.9* 13.9*  RBC 3.82* 3.87*  HGB 13.6 13.5  HCT 40.8 39.6  MCV 106.8* 102.3*  MCH 35.6* 34.9*  MCHC 33.3 34.1  RDW 11.9 11.9  PLT 304 315    BNP Recent Labs  Lab 08/21/19 1501  BNP 1,035.8*     DDimer No results for input(s): DDIMER in the last 168 hours.   Radiology    ECHOCARDIOGRAM COMPLETE  Result Date: 08/22/2019    ECHOCARDIOGRAM REPORT   Patient Name:   BLYTHE HARTSHORN Date of Exam: 08/22/2019 Medical Rec #:  [id number]        Height:       67.0 in Accession #:    [id number]       Weight:       179.0 lb Date of Birth:   12-Apr-1965         BSA:          1.929 m Patient Age:    54 years         BP:           106/78 mmHg Patient Gender: M                HR:           101 bpm. Exam Location:  Inpatient Procedure: 2D Echo, Cardiac Doppler, Color Doppler and Intracardiac            Opacification Agent Indications:    CHF-Acute Systolic 428.21 / I50.21  History:        Patient has prior history of Echocardiogram examinations, most                 recent 10/22/2016. CHF and Cardiomyopathy, CAD and Previous                 Myocardial Infarction; Risk Factors:Current Smoker.  Sonographer:    Renella Cunas RDCS Referring Phys: [id number] Evern Bio WEAVER IMPRESSIONS  1. Left ventricular ejection fraction, by estimation, is 10-15%. The left ventricle has severely decreased function. The left ventricle demonstrates global hypokinesis. The left ventricular internal cavity size was mildly to moderately dilated. Left ventricular diastolic parameters are consistent with Grade II diastolic dysfunction (pseudonormalization). Elevated left atrial pressure.  2. Right ventricular systolic function is mildly reduced. The right ventricular size is normal. Tricuspid regurgitation signal is inadequate for assessing PA pressure.  3. The mitral valve is grossly normal. Mild mitral valve regurgitation. No evidence of mitral stenosis.  4. The aortic valve is tricuspid. Aortic valve regurgitation is not visualized. No aortic stenosis is present.  5. The inferior vena cava is normal in size with <50% respiratory variability, suggesting right atrial pressure of 8 mmHg.  Comparison(s): Changes from prior study are noted. EF now 10-15%. FINDINGS  Left Ventricle: Left ventricular ejection fraction, by estimation, is 10-15%. The left ventricle has severely decreased function. The left ventricle demonstrates global hypokinesis. Definity contrast agent was given IV to delineate the left ventricular endocardial borders. The left ventricular internal cavity size was mildly to  moderately dilated. There is no left ventricular hypertrophy. Left ventricular diastolic parameters are consistent with Grade II diastolic dysfunction (pseudonormalization). Elevated left atrial pressure. Right Ventricle: The right ventricular size is normal. No increase in right ventricular wall thickness. Right ventricular systolic function is mildly reduced. Tricuspid regurgitation signal is inadequate for assessing PA pressure. Left Atrium: Left atrial size was normal in size. Right Atrium: Right atrial size was normal in size. Pericardium: Trivial pericardial effusion is present. Mitral Valve: The mitral valve is grossly normal. Mild mitral annular calcification. Mild mitral valve regurgitation. No evidence of mitral valve stenosis. Tricuspid Valve: The tricuspid valve is grossly normal. Tricuspid valve regurgitation is trivial. No evidence of tricuspid stenosis. Aortic Valve: The aortic valve is tricuspid. Aortic valve regurgitation is not visualized. No aortic stenosis is present. Pulmonic Valve: The pulmonic valve was grossly normal. Pulmonic valve regurgitation is not visualized. No evidence of pulmonic stenosis. Aorta: The aortic root is normal in size and structure. Venous: The inferior vena cava is normal in size with less than 50% respiratory variability, suggesting right atrial pressure of 8 mmHg. IAS/Shunts: The atrial septum is grossly normal. Additional Comments: There is a small pleural effusion in the left lateral region.  LEFT VENTRICLE PLAX 2D LVIDd:         6.20 cm      Diastology LVIDs:         5.70 cm      LV e' lateral:   5.33 cm/s LV PW:         0.70 cm      LV E/e' lateral: 13.6 LV IVS:        0.70 cm      LV e' medial:    3.30 cm/s LVOT diam:     2.10 cm      LV E/e' medial:  21.9 LV SV:         32 LV SV Index:   17 LVOT Area:     3.46 cm  LV Volumes (MOD) LV vol d, MOD A2C: 281.0 ml LV vol d, MOD A4C: 235.0 ml LV vol s, MOD A2C: 243.0 ml LV vol s, MOD A4C: 196.0 ml LV SV MOD A2C:      38.0 ml LV SV MOD A4C:     235.0 ml LV SV MOD BP:      45.6 ml RIGHT VENTRICLE RV S prime:     6.15 cm/s TAPSE (M-mode): 1.2 cm LEFT ATRIUM           Index       RIGHT ATRIUM           Index LA diam:      4.10 cm 2.13 cm/m  RA Area:     14.10 cm LA Vol (A2C): 62.9 ml 32.61 ml/m RA Volume:   35.70 ml  18.51 ml/m LA Vol (A4C): 36.9 ml 19.13 ml/m  AORTIC VALVE LVOT Vmax:   65.40 cm/s LVOT Vmean:  43.000 cm/s LVOT VTI:    0.093 m  AORTA Ao Root diam: 2.70 cm MITRAL VALVE MV Area (PHT): 5.27 cm    SHUNTS MV Decel Time: 144 msec  Systemic VTI:  0.09 m MV E velocity: 72.40 cm/s  Systemic Diam: 2.10 cm MV A velocity: 51.40 cm/s MV E/A ratio:  1.41 Lennie OdorWesley O'Neal MD Electronically signed by Lennie OdorWesley O'Neal MD Signature Date/Time: 08/22/2019/11:03:59 AM    Final     Cardiac Studies   2D echo 08/22/19  1. Left ventricular ejection fraction, by estimation, is 10-15%. The left  ventricle has severely decreased function. The left ventricle demonstrates  global hypokinesis. The left ventricular internal cavity size was mildly  to moderately dilated. Left  ventricular diastolic parameters are consistent with Grade II diastolic  dysfunction (pseudonormalization). Elevated left atrial pressure.  2. Right ventricular systolic function is mildly reduced. The right  ventricular size is normal. Tricuspid regurgitation signal is inadequate  for assessing PA pressure.  3. The mitral valve is grossly normal. Mild mitral valve regurgitation.  No evidence of mitral stenosis.  4. The aortic valve is tricuspid. Aortic valve regurgitation is not  visualized. No aortic stenosis is present.  5. The inferior vena cava is normal in size with <50% respiratory  variability, suggesting right atrial pressure of 8 mmHg.   Comparison(s): Changes from prior study are noted. EF now 10-15%.   Patient Profile     54 y.o. 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 He Mr.Godfreywas lost to follow-up after November 2018. He has been on disability and has no insurance.He has only been taking aspirin, fish oil and multivitamins. He smokes about a pack and a half of cigarettes per day and drinks about 4 alcoholic drinks (Seagrams and coke) per day. [facility] worsening SOB, orthopnea, DOE, PND, abdominal distention. Presentation felt c/w a/c combined heart failure.  Assessment & Plan    1. Acute on chronic combined CHF/ICM - LVEF progressively reduced to severe cardiomyopathy, likely due to continued CAD, medication noncompliance, ongoing ETOH abuse - clinical scenario remains challenging as patient is refusing labs intermittently - admit weight 179 and not changing but he has diuresed -5.3L - hold off on BB with MD given low blood pressure and severely low EF/decompensated CHF - low dose ARB/spironolactone added yesterday in addition to IV lasix  - rx'd living better with CHF book, discussed daily weights with pt and lifestyle modifications - at this point is well appearing - he asked about repeat CXR but lungs are clear - will hold further IV Lasix/KCl and discuss plan with MD  2. CAD s/p prior PCI - troponins low/flat not suggestive of ACS - resumed on ASA, statin - as patient has previously and continued to decline idea of CABG, no specific indication to revisit cath at present time  3. Essential HTN - managed in context of the above  4. Hyperlipidemia - restarted on atorvastatin this admission, need to follow LFTs as outpatient given ETOH and abnormality on admission - awaiting labs today if patient allows  5. Tobacco/ETOH abuse - on CIWA protocol with transitions of care substance abuse consult - cessation advised  6. Hypoalbuminemia - suspect nutritional/ETOH - UA without proteinuria  7. PVCs - BMET/Mg  OK yesterday (pt declined labs again this AM but is now agreeable)  Tentatively arranged TOC f/u on 8/30. Will discuss plan for f/u labs with MD.  For questions or updates, please contact [facility] Please consult [email or web address] for contact info under Cardiology/STEMI.  Signed,  Laurann Montana, PA-C 08/24/2019, 7:15 AM    Patient seen, examined. Available data reviewed. Agree with findings, assessment, and plan as outlined by Ronie Spies, PA-C.  On my exam the patient is alert, oriented, in no distress.  JVP is normal, lungs are clear bilaterally, heart is regular rate and rhythm with no murmur gallop, abdomen is soft and nontender, extremities have no edema.  The patient has diuresed well with intravenous furosemide.  I think he is medically stable for hospital discharge today.  After further review of his long history of noncompliance, it is probably best to keep him off of spironolactone as I am not confident that he will have follow-up labs done.  The importance of medication adherence, alcohol cessation, and compliance with follow-up has been discussed extensively with the patient.  He will be discharged on losartan 12.5 mg daily and furosemide 40 mg daily.  We will also supplement potassium with K-Dur 10 mEq daily.  Outpatient labs and follow-up as outlined above.  Tonny Bollman, M.D. 08/24/2019 10:57 AM

## 2019-08-24 NOTE — Telephone Encounter (Signed)
    Attention TOC pool,  This patient will need a TOC phone call after discharge. They are being discharged likely today or tomorrow. Follow-up appointment has already been arranged with: Norma Fredrickson on 8/30 They are a patient of Tonny Bollman, MD.  Thank you! Laurann Montana, PA-C

## 2019-08-24 NOTE — Progress Notes (Signed)
   NURSING PROGRESS NOTE  Eduardo [name] [id number] Discharge Data: 08/24/2019 12:49 PM Attending Provider: Duke Salvia, MD SWN:[id number], No Pcp Per     Cherylin Mylar to be D/C'd Home per MD order.  Discussed with the patient the After Visit Summary and all questions fully answered. All IV's discontinued with no bleeding noted. All belongings returned to patient for patient to take home. Pt wheeled downstairs via wheelchair accompanied by a staff member.  Last Vital Signs:  Blood pressure 106/87, pulse 99, temperature 98.6 F (37 C), temperature source Oral, resp. rate 18, height 5\' 7"  (1.702 m), weight 81.2 kg, SpO2 99 %.  Discharge Medication List Allergies as of 08/24/2019      Reactions   Penicillins Itching      Medication List    STOP taking these medications   bisacodyl 5 MG EC tablet Commonly known as: DULCOLAX   clopidogrel 75 MG tablet Commonly known as: PLAVIX   docusate sodium 100 MG capsule Commonly known as: COLACE   FISH OIL PO   lisinopril 2.5 MG tablet Commonly known as: ZESTRIL   metoprolol succinate 25 MG 24 hr tablet Commonly known as: TOPROL-XL   spironolactone 25 MG tablet Commonly known as: ALDACTONE   terbinafine 1 % cream Commonly known as: LAMISIL     TAKE these medications   aspirin 81 MG EC tablet Take 1 tablet (81 mg total) by mouth daily.   atorvastatin 80 MG tablet Commonly known as: LIPITOR Take 1 tablet (80 mg total) by mouth daily.   folic acid 1 MG tablet Commonly known as: FOLVITE Take 1 tablet (1 mg total) by mouth daily. Start taking on: August 25, 2019   furosemide 40 MG tablet Commonly known as: LASIX Take 1 tablet (40 mg total) by mouth daily. What changed:   medication strength  how much to take  additional instructions   losartan 25 MG tablet Commonly known as: COZAAR Take 0.5 tablets (12.5 mg total) by mouth daily. Start taking on: August 25, 2019   multivitamin with minerals Tabs  tablet Take 1 tablet by mouth daily. Please take a standard adult multivamin. Start taking on: August 25, 2019   potassium chloride 10 MEQ tablet Commonly known as: KLOR-CON Take 1 tablet (10 mEq total) by mouth daily. Start taking on: August 25, 2019   thiamine 100 MG tablet Take 1 tablet (100 mg total) by mouth daily. Start taking on: August 25, 2019

## 2019-08-24 NOTE — Progress Notes (Signed)
CSW offered patient [facility]. Patient accepted.  CSW will continue to follow.

## 2019-08-24 NOTE — Discharge Summary (Addendum)
Discharge Summary    Patient ID: Eduardo [name] Arpin MRN: [id number]; DOB: 12/14/1965  Admit date: 08/21/2019 Discharge date: 08/24/2019  Primary Care Provider: Patient, No Pcp Per  Primary Cardiologist: Tonny BollmanMichael Cooper, MD  Primary Electrophysiologist:  None   Discharge Diagnoses    Principal Problem:   Acute on chronic combined systolic and diastolic CHF (congestive heart failure) (HCC) Active Problems:   Elevated troponin   CAD (coronary artery disease)   Tobacco abuse   Alcohol abuse   Hyperlipidemia   PVC's (premature ventricular contractions)   Hypoalbuminemia   Diagnostic Studies/Procedures    2D echo 08/22/19  1. Left ventricular ejection fraction, by estimation, is 10-15%. The left  ventricle has severely decreased function. The left ventricle demonstrates  global hypokinesis. The left ventricular internal cavity size was mildly  to moderately dilated. Left  ventricular diastolic parameters are consistent with Grade II diastolic  dysfunction (pseudonormalization). Elevated left atrial pressure.  2. Right ventricular systolic function is mildly reduced. The right  ventricular size is normal. Tricuspid regurgitation signal is inadequate  for assessing PA pressure.  3. The mitral valve is grossly normal. Mild mitral valve regurgitation.  No evidence of mitral stenosis.  4. The aortic valve is tricuspid. Aortic valve regurgitation is not  visualized. No aortic stenosis is present.  5. The inferior vena cava is normal in size with <50% respiratory  variability, suggesting right atrial pressure of 8 mmHg.   Comparison(s): Changes from prior study are noted. EF now 10-15%.    _____________   History of Present Illness     Eduardo [name] is [name] 54 y.o.malewith history of CAD (MI 2008 s/p PCI to LCx, NSTEMI 10/2016 with multivessel disease -> patient refused CABG/self-discontinued Lifevest), chronic combined CHF/ICM EF 20% by echo 2018, HTN, HLD, ETOH, tobacco use.  Mr.Godfreywas lost to follow-up after November 2018. He has been on disability and has had no insurance.He has only been taking aspirin, fish oil and multivitamins. He smokes about [name] pack and [name] half of cigarettes per day and drinks about 4 alcoholic drinks (Seagrams and coke) per day.[facility] with worsening SOB, orthopnea, DOE, PND, abdominal distention. His presentation was felt c/w [name]/c combined heart failure.  Hospital Course     1. Acute on chronic combined CHF/ICM - LVEF 10-15% by echo this admission,progressively reduced to severe cardiomyopathy, likely due to continued CAD, medication noncompliance, ongoing ETOH abuse - clinical scenario was challenging as patient would intermittently decline lab drawns but agreeable later in day - admit weight 179 and not significantly changed but he has diuresed -5.3L with clinical improvement -> lungs now clear, abdominal distention much improved - guideline directed therapy limited by soft blood pressure - we will hold off on BB with MDgiven low blood pressure andseverely low EF/decompensated CHF. We have concerns about re-initiating spironolactone given pt's noncompliance and episodic refusal of labs. We will dc on Lasix and ARB - recommend CMET at f/u visit - patient provided with CHF booklet, discussed daily weights with pt and lifestyle modifications - will need re-evaluation of LV function arranged at outpatient to consider whether ICD needs to be considered - not clear candidate given h/o noncompliance but can revisit conversation contingent on how he does as outpatient  2. CAD s/p prior PCI (correction to prior note - NO hx of CABG) - troponins low/flat not suggestive of ACS - resumed on ASA, statin - as patient has previously and continued to decline idea of CABG, no specific indication to  revisit cath at present time - no SL NTG rx at d/c given tendency for hypotension  3. Essential HTN, with soft BP now in context of  cardiomyopathy - managed in context of the above  4. Hyperlipidemia - restarted on atorvastatin this admission, recommend follow LFTs as outpatient given ETOH and abnormality on admission - liver function improved today  5. Tobacco/ETOH abuse - on CIWA protocol with transitions of care substance abuse consult - cessation advised - to continue thiamine, folic acid, MVI as outpatient until cessation of alcohol - pt reports he has OTC MVI at home already  6. Hypoalbuminemia - suspect nutritional/ETOH - UA without proteinuria  7. PVCs - will d/c on low dose potassium (magnesium OK)  8. Chronic leukocytosis - recommended to f/u PCP for this  The patient is feeling much better today and wishes to go home. Med list as outlined below. Please note, on "stopped" list below, most of these are medicines the patient self-discontinued quite [name] while ago (rather than explicitly stopped here). We will utilize the [facility] pharmacy and care management to provide assistance with his discharge regimen. Dr. Excell Seltzer has seen and examined the patient today and feels he is stable for discharge.   Did the patient have an acute coronary syndrome (MI, NSTEMI, STEMI, etc) this admission?:  No.   The elevated Troponin was due to the acute medical illness (demand ischemia).  _____________  Discharge Vitals Blood pressure 106/87, pulse 99, temperature 98.6 F (37 C), temperature source Oral, resp. rate 18, height 5\' 7"  (1.702 m), weight 81.2 kg, SpO2 99 %.  Filed Weights   08/22/19 0451 08/23/19 0403 08/24/19 0422  Weight: 81.2 kg 81.2 kg 81.2 kg    Labs & Radiologic Studies    CBC Recent Labs    08/21/19 2145  WBC 13.9*  HGB 13.5  HCT 39.6  MCV 102.3*  PLT 315   Basic Metabolic Panel Recent Labs    2146 0917 08/24/19 0821  NA 136 135  K 4.0 3.7  CL 99 98  CO2 26 27  GLUCOSE 108* 168*  BUN 12 15  CREATININE 0.87 1.04  CALCIUM 9.6 9.5  MG 2.2 2.0   Liver Function Tests Recent Labs     08/21/19 2030 08/24/19 0821  AST 91* 32  ALT 126* 59*  ALKPHOS 112 89  BILITOT 1.2 0.6  PROT 6.5 6.7  ALBUMIN 3.0* 3.0*   No results for input(s): LIPASE, AMYLASE in the last 72 hours. High Sensitivity Troponin:   Recent Labs  Lab 08/21/19 1022 08/21/19 1501  TROPONINIHS 30* 34*     Hemoglobin A1C Recent Labs    08/24/19 0821  HGBA1C 5.2   Fasting Lipid Panel Recent Labs    08/22/19 0921  CHOL 144  HDL 35*  LDLCALC 93  TRIG 78  CHOLHDL 4.1   Thyroid Function Tests Recent Labs    08/21/19 1954  TSH 2.060   _____________  DG Chest 2 View  Result Date: 08/21/2019 CLINICAL DATA:  Shortness of breath for 2-3 days. EXAM: CHEST - 2 VIEW COMPARISON:  Chest radiograph 10/22/2016. FINDINGS: Cardiac contours are enlarged. Small to moderate bilateral pleural effusions, left greater than right. Bilateral mid and lower lung airspace opacities. No pneumothorax. Thoracic spine degenerative changes. IMPRESSION: 1. Cardiomegaly. 2. Small to moderate bilateral pleural effusions and bilateral mid lower lung opacities which may represent edema and or atelectasis. Infection not excluded. Electronically Signed   By: 10/24/2016 M.D.   On: 08/21/2019 10:37  ECHOCARDIOGRAM COMPLETE  Result Date: 08/22/2019    ECHOCARDIOGRAM REPORT   Patient Name:   ANUJ SUMMONS Date of Exam: 08/22/2019 Medical Rec #:  [id number]        Height:       67.0 in Accession #:    [id number]       Weight:       179.0 lb Date of Birth:  1965-09-09         BSA:          1.929 m Patient Age:    54 years         BP:           106/78 mmHg Patient Gender: M                HR:           101 bpm. Exam Location:  Inpatient Procedure: 2D Echo, Cardiac Doppler, Color Doppler and Intracardiac            Opacification Agent Indications:    CHF-Acute Systolic 428.21 / I50.21  History:        Patient has prior history of Echocardiogram examinations, most                 recent 10/22/2016. CHF and Cardiomyopathy, CAD and Previous                  Myocardial Infarction; Risk Factors:Current Smoker.  Sonographer:    Renella Cunas RDCS Referring Phys: [id number] Evern Bio WEAVER IMPRESSIONS  1. Left ventricular ejection fraction, by estimation, is 10-15%. The left ventricle has severely decreased function. The left ventricle demonstrates global hypokinesis. The left ventricular internal cavity size was mildly to moderately dilated. Left ventricular diastolic parameters are consistent with Grade II diastolic dysfunction (pseudonormalization). Elevated left atrial pressure.  2. Right ventricular systolic function is mildly reduced. The right ventricular size is normal. Tricuspid regurgitation signal is inadequate for assessing PA pressure.  3. The mitral valve is grossly normal. Mild mitral valve regurgitation. No evidence of mitral stenosis.  4. The aortic valve is tricuspid. Aortic valve regurgitation is not visualized. No aortic stenosis is present.  5. The inferior vena cava is normal in size with <50% respiratory variability, suggesting right atrial pressure of 8 mmHg. Comparison(s): Changes from prior study are noted. EF now 10-15%. FINDINGS  Left Ventricle: Left ventricular ejection fraction, by estimation, is 10-15%. The left ventricle has severely decreased function. The left ventricle demonstrates global hypokinesis. Definity contrast agent was given IV to delineate the left ventricular endocardial borders. The left ventricular internal cavity size was mildly to moderately dilated. There is no left ventricular hypertrophy. Left ventricular diastolic parameters are consistent with Grade II diastolic dysfunction (pseudonormalization). Elevated left atrial pressure. Right Ventricle: The right ventricular size is normal. No increase in right ventricular wall thickness. Right ventricular systolic function is mildly reduced. Tricuspid regurgitation signal is inadequate for assessing PA pressure. Left Atrium: Left atrial size was normal in size. Right  Atrium: Right atrial size was normal in size. Pericardium: Trivial pericardial effusion is present. Mitral Valve: The mitral valve is grossly normal. Mild mitral annular calcification. Mild mitral valve regurgitation. No evidence of mitral valve stenosis. Tricuspid Valve: The tricuspid valve is grossly normal. Tricuspid valve regurgitation is trivial. No evidence of tricuspid stenosis. Aortic Valve: The aortic valve is tricuspid. Aortic valve regurgitation is not visualized. No aortic stenosis is present. Pulmonic Valve: The pulmonic valve was grossly normal. Pulmonic valve regurgitation is  not visualized. No evidence of pulmonic stenosis. Aorta: The aortic root is normal in size and structure. Venous: The inferior vena cava is normal in size with less than 50% respiratory variability, suggesting right atrial pressure of 8 mmHg. IAS/Shunts: The atrial septum is grossly normal. Additional Comments: There is [name] small pleural effusion in the left lateral region.  LEFT VENTRICLE PLAX 2D LVIDd:         6.20 cm      Diastology LVIDs:         5.70 cm      LV e' lateral:   5.33 cm/s LV PW:         0.70 cm      LV E/e' lateral: 13.6 LV IVS:        0.70 cm      LV e' medial:    3.30 cm/s LVOT diam:     2.10 cm      LV E/e' medial:  21.9 LV SV:         32 LV SV Index:   17 LVOT Area:     3.46 cm  LV Volumes (MOD) LV vol d, MOD A2C: 281.0 ml LV vol d, MOD A4C: 235.0 ml LV vol s, MOD A2C: 243.0 ml LV vol s, MOD A4C: 196.0 ml LV SV MOD A2C:     38.0 ml LV SV MOD A4C:     235.0 ml LV SV MOD BP:      45.6 ml RIGHT VENTRICLE RV S prime:     6.15 cm/s TAPSE (M-mode): 1.2 cm LEFT ATRIUM           Index       RIGHT ATRIUM           Index LA diam:      4.10 cm 2.13 cm/m  RA Area:     14.10 cm LA Vol (A2C): 62.9 ml 32.61 ml/m RA Volume:   35.70 ml  18.51 ml/m LA Vol (A4C): 36.9 ml 19.13 ml/m  AORTIC VALVE LVOT Vmax:   65.40 cm/s LVOT Vmean:  43.000 cm/s LVOT VTI:    0.093 m  AORTA Ao Root diam: 2.70 cm MITRAL VALVE MV Area (PHT):  5.27 cm    SHUNTS MV Decel Time: 144 msec    Systemic VTI:  0.09 m MV E velocity: 72.40 cm/s  Systemic Diam: 2.10 cm MV [name] velocity: 51.40 cm/s MV E/[name] ratio:  1.41 Lennie Odor MD Electronically signed by Lennie Odor MD Signature Date/Time: 08/22/2019/11:03:59 AM    Final    Disposition   Pt is being discharged home today in good condition.  Follow-up Plans & Appointments     Follow-up Information    Rosalio Macadamia, NP Follow up.   Specialties: Nurse Practitioner, Interventional Cardiology, Cardiology, Radiology Why: [name] follow-up appointment has been made for you on Monday September 06, 2019 2:15 PM (Arrive by 2:00 PM). Lawson Fiscal is one of the nurse practitioners that works closely with our cardiology team. Contact information: [address] [place] [address] [phone]        Primary Care Provider Follow up.   Why: Please follow up with your primary care provider for your chronically elevated white blood cell count.             Discharge Instructions    Diet - low sodium heart healthy   Complete by: As directed    Discharge instructions   Complete by: As directed  Most of the medicines listed as "stopped" above are medicines you had not been taking recently. Please pay attention to your new medicine list and call our office if you have any questions at all. [phone])   Increase activity slowly   Complete by: As directed       Discharge Medications   Allergies as of 08/24/2019      Reactions   Penicillins Itching      Medication List    STOP taking these medications   bisacodyl 5 MG EC tablet Commonly known as: DULCOLAX   clopidogrel 75 MG tablet Commonly known as: PLAVIX   docusate sodium 100 MG capsule Commonly known as: COLACE   FISH OIL PO   lisinopril 2.5 MG tablet Commonly known as: ZESTRIL   metoprolol succinate 25 MG 24 hr tablet Commonly known as: TOPROL-XL   spironolactone 25 MG tablet Commonly known  as: ALDACTONE   terbinafine 1 % cream Commonly known as: LAMISIL     TAKE these medications   aspirin 81 MG EC tablet Take 1 tablet (81 mg total) by mouth daily.   atorvastatin 80 MG tablet Commonly known as: LIPITOR Take 1 tablet (80 mg total) by mouth daily.   folic acid 1 MG tablet Commonly known as: FOLVITE Take 1 tablet (1 mg total) by mouth daily. Start taking on: August 25, 2019   furosemide 40 MG tablet Commonly known as: LASIX Take 1 tablet (40 mg total) by mouth daily. What changed:   medication strength  how much to take  additional instructions   losartan 25 MG tablet Commonly known as: COZAAR Take 0.5 tablets (12.5 mg total) by mouth daily. Start taking on: August 25, 2019   multivitamin with minerals Tabs tablet Take 1 tablet by mouth daily. Please take [name] standard adult multivamin. Start taking on: August 25, 2019   potassium chloride 10 MEQ tablet Commonly known as: KLOR-CON Take 1 tablet (10 mEq total) by mouth daily. Start taking on: August 25, 2019   thiamine 100 MG tablet Take 1 tablet (100 mg total) by mouth daily. Start taking on: August 25, 2019          Outstanding Labs/Studies   Recommend f/u kidney function, electrolytes and liver function as outpatient  Duration of Discharge Encounter   Greater than 30 minutes including physician time.  Signed, Laurann Montana, PA-C 08/24/2019, 11:34 AM

## 2019-08-25 NOTE — Telephone Encounter (Signed)
**Note De-Identified Eduardo [name] Obfuscation** Patient contacted regarding discharge from [facility] on 08/24/2019.  Patient understands to follow up with provider Norma Fredrickson, NP on 09/06/2019 at 2:15 at [address] In [place], [place] [address]. Patient understands discharge instructions? Yes Patient understands medications and regiment? Yes Patient understands to bring all medications to this visit? Yes  Ask patient:  Are you enrolled in My Chart: No, the pt states that he is not sure if he is interested in signing up at this time but is considering it and states that if he needs assistance doing so he will call us.

## 2019-08-30 NOTE — Progress Notes (Deleted)
CARDIOLOGY OFFICE NOTE  Date:  09/01/2019    Eduardo [name] Date of Birth: Nov 17, 1965 Medical Record #[id number]  PCP:  Patient, No Pcp Per  Cardiologist:  Excell Seltzer   No chief complaint on file.   History of Present Illness: Eduardo [name] is a 54 y.o. male who presents today for a post hospital visit. Seen for Dr. Excell Seltzer.   He has a history of CAD (MI 2008 s/p PCI to LCx, NSTEMI 10/2016 with multivessel disease -> patient refused CABG/self-discontinued Lifevest), chronic combined CHF/ICM EF 20% by echo 2018, HTN, HLD, ETOH and tobacco use.   He was last seen here in 2018 and then lost to follow up due to no insurance.   Presented earlier this month to the hospital. Continuing to smoke about a pack and a half of cigarettes per day and drinking about 4 alcoholic drinks (Seagrams and coke) per day. Has no insurance.Only taking aspirin, fish oil and multivitamins. Presented with worsening SOB, orthopnea, DOE, PND, abdominal distention c/w acute on chronic combined heart failure.  Comes in today. Here with   Past Medical History:  Diagnosis Date  . Alcohol abuse   . Anxiety   . CAD in native artery    a. MI in 2008 treated with stenting to the left circumflex. b. post-op NSTEMI with progressive 3V disease 10/2016 - recommended for bypass after recovering from ortho surgery but patient declined.  . Chronic combined systolic and diastolic CHF (congestive heart failure) (HCC)   . Dyspnea   . Ischemic cardiomyopathy   . Mild mitral regurgitation   . Myocardial infarction [facility])    July 2008  . Tobacco abuse     Past Surgical History:  Procedure Laterality Date  . ANKLE SURGERY    . CARDIAC CATHETERIZATION     stent placed 2008  . LEFT HEART CATH AND CORONARY ANGIOGRAPHY N/A 10/22/2016   Procedure: LEFT HEART CATH AND CORONARY ANGIOGRAPHY;  Surgeon: Lyn Records, MD;  Location: [facility] INVASIVE CV LAB;  Service: Cardiovascular;  Laterality: N/A;  . SHOULDER SURGERY    .  TIBIA IM NAIL INSERTION Left 10/21/2016   Procedure: INTRAMEDULLARY (IM) ROD TIBIA/FIBULA FRACTURE;  Surgeon: Jodi Geralds, MD;  Location: [facility] OR;  Service: Orthopedics;  Laterality: Left;  . TONSILLECTOMY       Medications: No outpatient medications have been marked as taking for the 09/06/19 encounter (Appointment) with Rosalio Macadamia, NP.     Allergies: Allergies  Allergen Reactions  . Penicillins Itching    Social History: The patient  reports that he has been smoking cigarettes. He has a 40.00 pack-year smoking history. He has never used smokeless tobacco. He reports current alcohol use. He reports that he does not use drugs.   Family History: The patient's ***family history includes Mitral valve prolapse in his mother and sister; Stroke in his father.   Review of Systems: Please see the history of present illness.   All other systems are reviewed and negative.   Physical Exam: VS:  There were no vitals taken for this visit. Marland Kitchen  BMI There is no height or weight on file to calculate BMI.  Wt Readings from Last 3 Encounters:  08/24/19 179 lb 0.2 oz (81.2 kg)  12/06/16 159 lb 6.4 oz (72.3 kg)  10/27/16 151 lb 4.8 oz (68.6 kg)    General: Pleasant. Well developed, well nourished and in no acute distress.   HEENT: Normal.  Neck: Supple, no JVD, carotid bruits, or masses  noted.  Cardiac: ***Regular rate and rhythm. No murmurs, rubs, or gallops. No edema.  Respiratory:  Lungs are clear to auscultation bilaterally with normal work of breathing.  GI: Soft and nontender.  MS: No deformity or atrophy. Gait and ROM intact.  Skin: Warm and dry. Color is normal.  Neuro:  Strength and sensation are intact and no gross focal deficits noted.  Psych: Alert, appropriate and with normal affect.   LABORATORY DATA:  EKG:  EKG {ACTION; IS/IS YSA:[id number]} ordered today.  Personally reviewed by me. This demonstrates ***.  Lab Results  Component Value Date   WBC 13.9 (H) 08/21/2019    HGB 13.5 08/21/2019   HCT 39.6 08/21/2019   PLT 315 08/21/2019   GLUCOSE 168 (H) 08/24/2019   CHOL 144 08/22/2019   TRIG 78 08/22/2019   HDL 35 (L) 08/22/2019   LDLCALC 93 08/22/2019   ALT 59 (H) 08/24/2019   AST 32 08/24/2019   NA 135 08/24/2019   K 3.7 08/24/2019   CL 98 08/24/2019   CREATININE 1.04 08/24/2019   BUN 15 08/24/2019   CO2 27 08/24/2019   TSH 2.060 08/21/2019   INR 1.03 10/22/2016   HGBA1C 5.2 08/24/2019     BNP (last 3 results) Recent Labs    08/21/19 1501  BNP 1,035.8*    ProBNP (last 3 results) No results for input(s): PROBNP in the last 8760 hours.   Other Studies Reviewed Today:  Diagnostic Studies/Procedures    2D echo 08/22/19  1. Left ventricular ejection fraction, by estimation, is 10-15%. The left  ventricle has severely decreased function. The left ventricle demonstrates  global hypokinesis. The left ventricular internal cavity size was mildly  to moderately dilated. Left  ventricular diastolic parameters are consistent with Grade II diastolic  dysfunction (pseudonormalization). Elevated left atrial pressure.  2. Right ventricular systolic function is mildly reduced. The right  ventricular size is normal. Tricuspid regurgitation signal is inadequate  for assessing PA pressure.  3. The mitral valve is grossly normal. Mild mitral valve regurgitation.  No evidence of mitral stenosis.  4. The aortic valve is tricuspid. Aortic valve regurgitation is not  visualized. No aortic stenosis is present.  5. The inferior vena cava is normal in size with <50% respiratory  variability, suggesting right atrial pressure of 8 mmHg.   Comparison(s): Changes from prior study are noted. EF now 10-15%.       ASSESSMENT & PLAN:    1. Acute on chronic combined CHF/ICM - LVEF 10-15% by echo this admission,progressively reduced to severe cardiomyopathy, likely due to continued CAD, medication noncompliance, ongoing ETOH abuse - clinical  scenario was challenging as patient would intermittently decline lab drawns but agreeable later in day - admit weight 179and not significantly changed but he has diuresed -5.3L with clinical improvement -> lungs now clear, abdominal distention much improved - guideline directed therapy limited by soft blood pressure - we will hold off on BB with MDgiven low blood pressure andseverely low EF/decompensated CHF. We have concerns about re-initiating spironolactone given pt's noncompliance and episodic refusal of labs. We will dc on Lasix and ARB - recommend CMET at f/u visit - patient provided with CHF booklet, discussed daily weights with pt and lifestyle modifications - will need re-evaluation of LV function arranged at outpatient to consider whether ICD needs to be considered - not clear candidate given h/o noncompliance but can revisit conversation contingent on how he does as outpatient  2. CAD s/p prior PCI (correction to prior note -  NO hx of CABG) - troponins low/flat not suggestive of ACS - resumed on ASA, statin - as patient has previously and continued to decline idea of CABG, no specific indication to revisit cath at present time - no SL NTG rx at d/c given tendency for hypotension  3. Essential HTN, with soft BP now in context of cardiomyopathy - managed in context of the above  4. Hyperlipidemia - restarted on atorvastatin this admission, recommend follow LFTsas outpatientgiven ETOH and abnormality on admission - liver function improved today  5. Tobacco/ETOH abuse -onCIWA protocol with transitions of care substance abuse consult - cessation advised - to continue thiamine, folic acid, MVI as outpatient until cessation of alcohol - pt reports he has OTC MVI at home already  6. Hypoalbuminemia - suspect nutritional/ETOH - UA without proteinuria  7. PVCs -will d/c on low dose potassium (magnesium OK)  8. Chronic leukocytosis - recommended to f/u PCP for this   The patient is feeling much better today and wishes to go home. Med list as outlined below. Please note, on "stopped" list below, most of these are medicines the patient self-discontinued quite a while ago (rather than explicitly stopped here). We will utilize the [facility] pharmacy and care management to provide assistance with his discharge regimen. Dr. Excell Seltzer has seen and examined the patient today and feels he is stable for discharge.     Current medicines are reviewed with the patient today.  The patient does not have concerns regarding medicines other than what has been noted above.  The following changes have been made:  See above.  Labs/ tests ordered today include:   No orders of the defined types were placed in this encounter.    Disposition:   FU with *** in {gen number 8-24:235361} {Days to years:10300}.   Patient is agreeable to this plan and will call if any problems develop in the interim.   SignedNorma Fredrickson, NP  09/01/2019 10:11 AM  [facility] [address] [place], [place]  [address] Phone: [phone] Fax: [phone]

## 2019-09-06 ENCOUNTER — Ambulatory Visit: Payer: Self-pay | Admitting: Nurse Practitioner

## 2019-12-13 ENCOUNTER — Other Ambulatory Visit: Payer: Self-pay | Admitting: Physician Assistant

## 2019-12-13 NOTE — Telephone Encounter (Signed)
OK to fill for 30 days but pt is overdue for cardiology OV following discharge in 08/2019 and needs f/u PCP too.

## 2019-12-13 NOTE — Telephone Encounter (Signed)
Pt's pharmacy is requesting a refill on folic acid and Vitamin B-1 768 mg tablets. Would Dayna Dunn,PA, like to refill these medications? Please address

## 2019-12-17 ENCOUNTER — Other Ambulatory Visit: Payer: Self-pay | Admitting: Physician Assistant

## 2019-12-17 MED FILL — LOSARTAN POTASSIUM 25 MG TA: 25 | 90 days supply | Qty: 45 | Fill #0

## 2019-12-17 MED FILL — FUROSEMIDE 40 MG TAB: 40 | 90 days supply | Qty: 90 | Fill #0

## 2019-12-17 MED FILL — POTASSIUM CHLORIDE CRYS ER: 10 | 30 days supply | Qty: 30 | Fill #0

## 2019-12-17 MED FILL — FOLIC ACID 1 MG TABS: 1 | 30 days supply | Qty: 30 | Fill #0

## 2019-12-17 MED FILL — ATORVASTATIN 80 MG TABLET: 80 | 90 days supply | Qty: 90 | Fill #0

## 2019-12-23 ENCOUNTER — Telehealth: Payer: Self-pay | Admitting: Cardiovascular Disease

## 2019-12-23 NOTE — Telephone Encounter (Signed)
Al Decant, medical examiner for [facility], states he was told by Eduardo [name] to contact Dr. Excell Seltzer in regards to signing a death certificate. Advised that Dr. Excell Seltzer was not in the office today and he has requested to speak with his nurse. Please advise.

## 2019-12-24 NOTE — Telephone Encounter (Signed)
Al Decant calling back.

## 2019-12-24 NOTE — Telephone Encounter (Signed)
Death certificate signed by Dr. Excell Seltzer and given to Medical Records who will get paperwork to appropriate place.

## 2019-12-24 NOTE — Telephone Encounter (Signed)
Eduardo [name] calling back. 

## 2020-01-08 DEATH — deceased

## 2021-02-19 IMAGING — CR DG CHEST 2V
2 series · 2 of 2 positions shown · non-contrast
Comparison: Chest radiograph 10/22/2016.

CLINICAL DATA: Shortness of breath for 2-3 days.

EXAM:
CHEST - 2 VIEW

[chest pa]
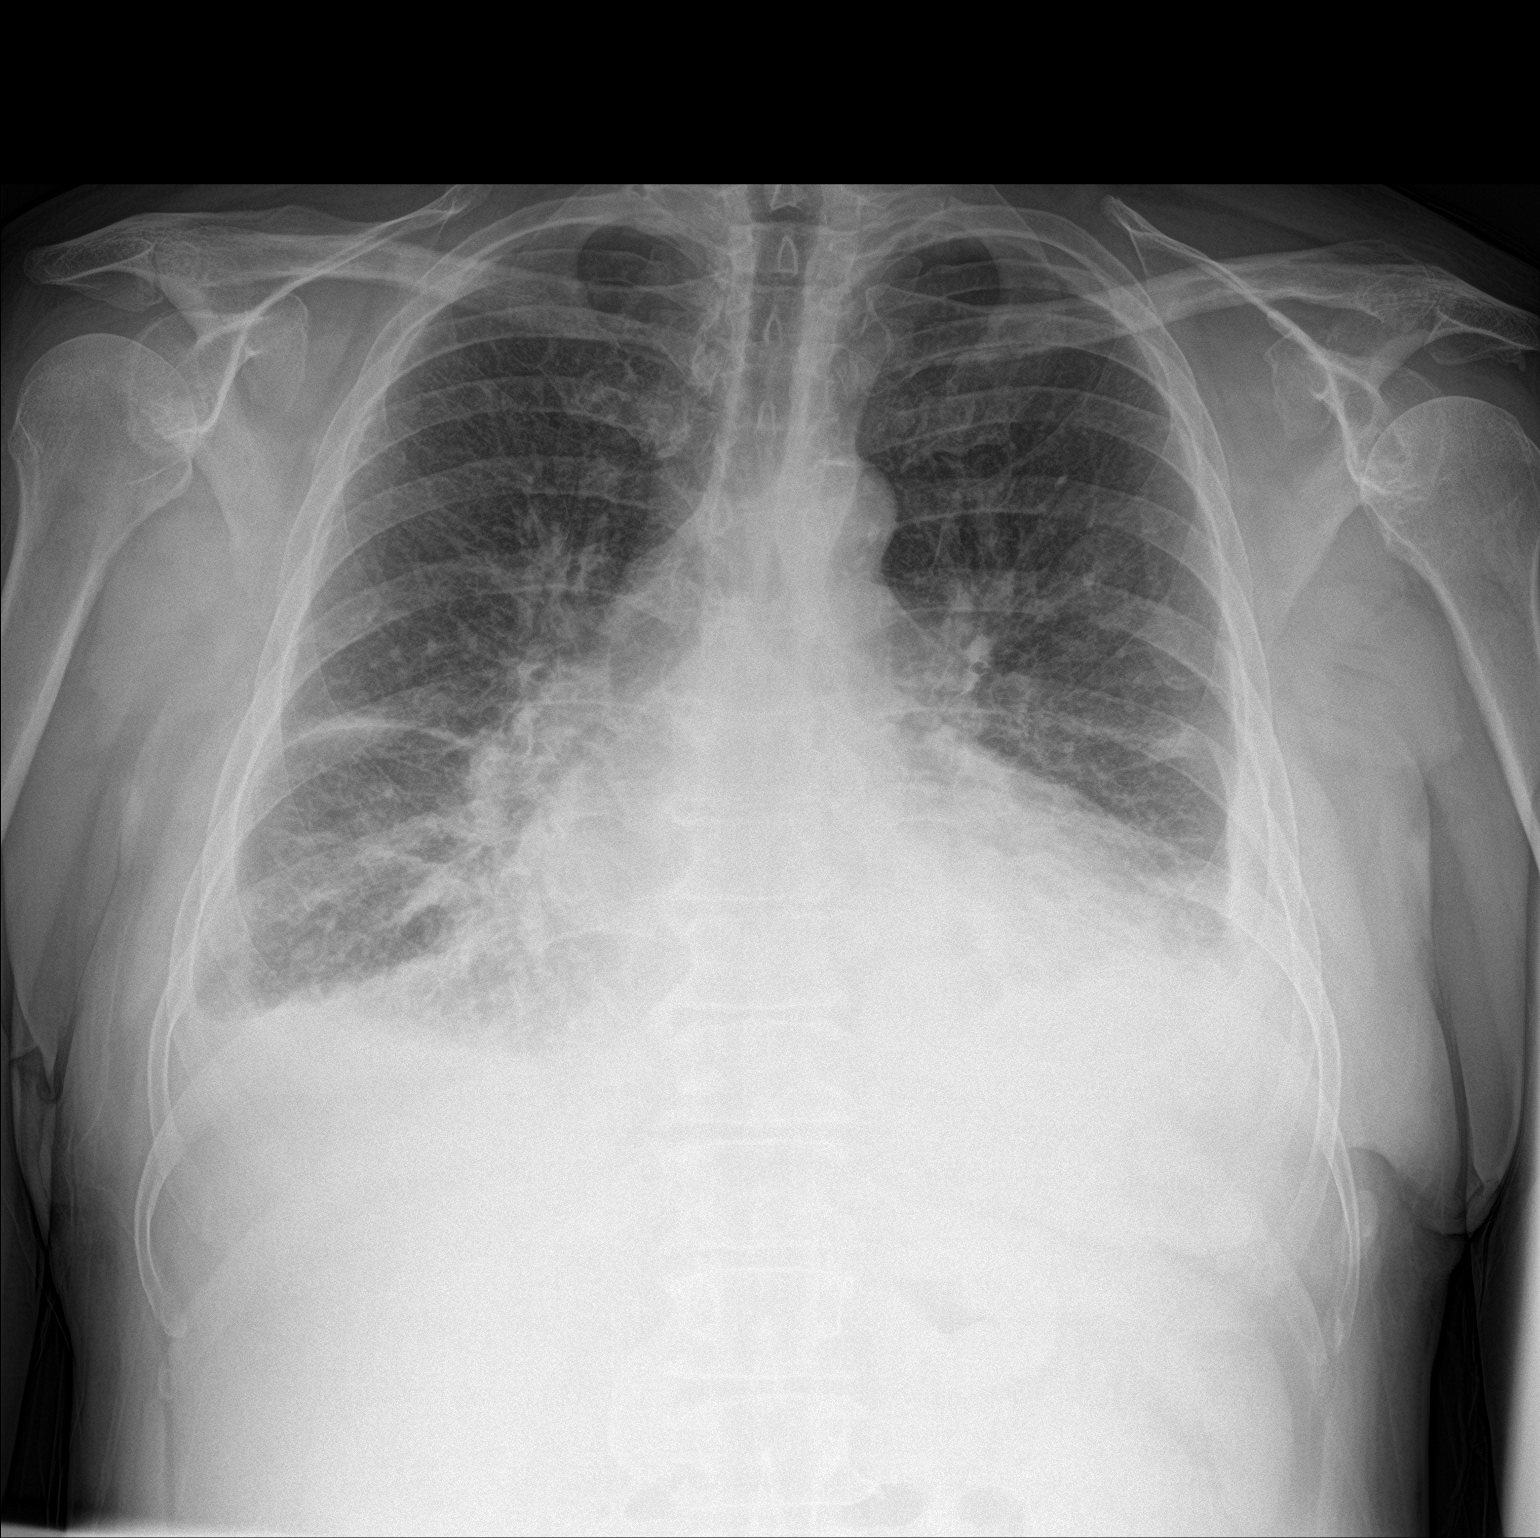

[chest lat]
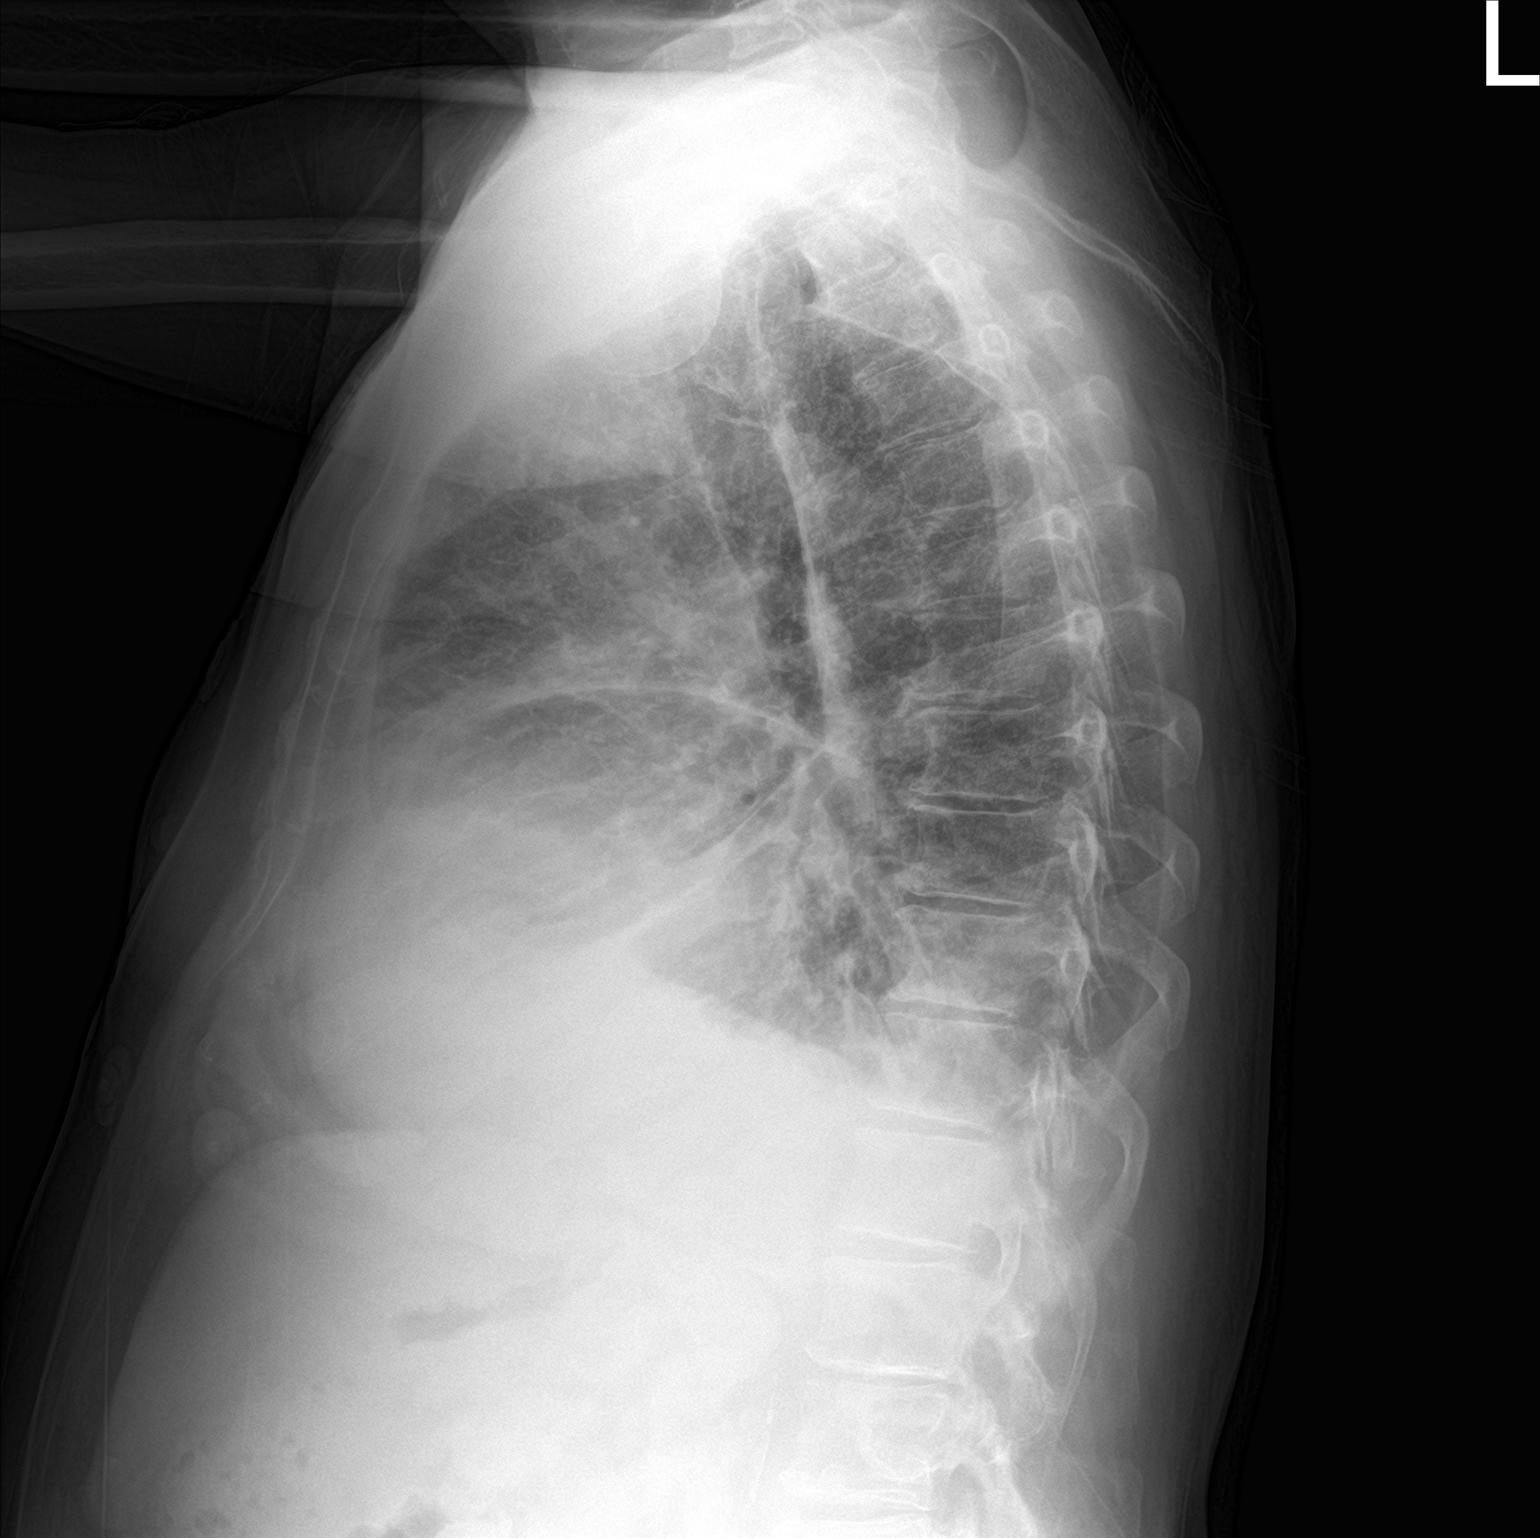

[2 of 2 positions shown; findings below may reference images not displayed]

FINDINGS: Cardiac contours are enlarged. Small to moderate bilateral pleural
effusions, left greater than right. Bilateral mid and lower lung
airspace opacities. No pneumothorax. Thoracic spine degenerative
changes.
IMPRESSION: 1. Cardiomegaly.
2. Small to moderate bilateral pleural effusions and bilateral mid
lower lung opacities which may represent edema and or atelectasis.
Infection not excluded.
